# Patient Record
Sex: Female | Born: 1965
Health system: Southern US, Community
[De-identification: ages and names within clinical notes are randomized; demographics above are authoritative.]

## PROBLEM LIST (undated history)

## (undated) ENCOUNTER — Ambulatory Visit (HOSPITAL_COMMUNITY): Admission: EM | Payer: Federal, State, Local not specified - PPO

## (undated) DIAGNOSIS — D649 Anemia, unspecified: Secondary | ICD-10-CM

## (undated) DIAGNOSIS — R739 Hyperglycemia, unspecified: Secondary | ICD-10-CM

## (undated) DIAGNOSIS — F419 Anxiety disorder, unspecified: Secondary | ICD-10-CM

## (undated) DIAGNOSIS — B009 Herpesviral infection, unspecified: Secondary | ICD-10-CM

## (undated) DIAGNOSIS — G4730 Sleep apnea, unspecified: Secondary | ICD-10-CM

## (undated) HISTORY — DX: Sleep apnea, unspecified: G47.30

## (undated) HISTORY — DX: Anxiety disorder, unspecified: F41.9

## (undated) HISTORY — DX: Hyperglycemia, unspecified: R73.9

## (undated) HISTORY — PX: COLONOSCOPY: SHX174

## (undated) HISTORY — PX: WISDOM TOOTH EXTRACTION: SHX21

---

## 1998-09-20 ENCOUNTER — Emergency Department (HOSPITAL_COMMUNITY): Admission: EM | Admit: 1998-09-20 | Discharge: 1998-09-20 | Payer: Self-pay | Admitting: Emergency Medicine

## 1998-09-20 ENCOUNTER — Encounter: Payer: Self-pay | Admitting: Emergency Medicine

## 1999-09-26 ENCOUNTER — Emergency Department (HOSPITAL_COMMUNITY): Admission: EM | Admit: 1999-09-26 | Discharge: 1999-09-26 | Payer: Self-pay | Admitting: Emergency Medicine

## 1999-09-26 ENCOUNTER — Encounter: Payer: Self-pay | Admitting: Emergency Medicine

## 2000-09-04 ENCOUNTER — Other Ambulatory Visit: Admission: RE | Admit: 2000-09-04 | Discharge: 2000-09-04 | Payer: Self-pay | Admitting: Gynecology

## 2001-10-23 ENCOUNTER — Other Ambulatory Visit: Admission: RE | Admit: 2001-10-23 | Discharge: 2001-10-23 | Payer: Self-pay | Admitting: Gynecology

## 2001-10-30 ENCOUNTER — Encounter: Payer: Self-pay | Admitting: Gynecology

## 2001-10-30 ENCOUNTER — Encounter: Admission: RE | Admit: 2001-10-30 | Discharge: 2001-10-30 | Payer: Self-pay | Admitting: Gynecology

## 2002-10-31 ENCOUNTER — Other Ambulatory Visit: Admission: RE | Admit: 2002-10-31 | Discharge: 2002-10-31 | Payer: Self-pay | Admitting: Gynecology

## 2003-10-14 ENCOUNTER — Other Ambulatory Visit: Admission: RE | Admit: 2003-10-14 | Discharge: 2003-10-14 | Payer: Self-pay | Admitting: Gynecology

## 2004-05-08 ENCOUNTER — Emergency Department (HOSPITAL_COMMUNITY): Admission: EM | Admit: 2004-05-08 | Discharge: 2004-05-08 | Payer: Self-pay | Admitting: Family Medicine

## 2004-07-02 ENCOUNTER — Emergency Department (HOSPITAL_COMMUNITY): Admission: EM | Admit: 2004-07-02 | Discharge: 2004-07-02 | Payer: Self-pay | Admitting: Family Medicine

## 2004-10-05 ENCOUNTER — Ambulatory Visit (HOSPITAL_BASED_OUTPATIENT_CLINIC_OR_DEPARTMENT_OTHER): Admission: RE | Admit: 2004-10-05 | Discharge: 2004-10-05 | Payer: Self-pay | Admitting: Gynecology

## 2004-10-05 ENCOUNTER — Ambulatory Visit (HOSPITAL_COMMUNITY): Admission: RE | Admit: 2004-10-05 | Discharge: 2004-10-05 | Payer: Self-pay | Admitting: Gynecology

## 2004-10-05 ENCOUNTER — Encounter (INDEPENDENT_AMBULATORY_CARE_PROVIDER_SITE_OTHER): Payer: Self-pay | Admitting: *Deleted

## 2004-11-10 ENCOUNTER — Other Ambulatory Visit: Admission: RE | Admit: 2004-11-10 | Discharge: 2004-11-10 | Payer: Self-pay | Admitting: Gynecology

## 2005-10-24 ENCOUNTER — Other Ambulatory Visit: Admission: RE | Admit: 2005-10-24 | Discharge: 2005-10-24 | Payer: Self-pay | Admitting: Gynecology

## 2006-11-08 ENCOUNTER — Other Ambulatory Visit: Admission: RE | Admit: 2006-11-08 | Discharge: 2006-11-08 | Payer: Self-pay | Admitting: Gynecology

## 2006-11-24 ENCOUNTER — Encounter: Admission: RE | Admit: 2006-11-24 | Discharge: 2006-11-24 | Payer: Self-pay | Admitting: Gynecology

## 2007-11-22 ENCOUNTER — Encounter: Admission: RE | Admit: 2007-11-22 | Discharge: 2007-11-22 | Payer: Self-pay | Admitting: Orthopedic Surgery

## 2007-11-26 ENCOUNTER — Encounter: Admission: RE | Admit: 2007-11-26 | Discharge: 2007-11-26 | Payer: Self-pay | Admitting: Gynecology

## 2008-06-13 HISTORY — PX: ABLATION: SHX5711

## 2008-06-13 HISTORY — PX: HYSTEROSCOPY: SHX211

## 2010-06-25 ENCOUNTER — Ambulatory Visit
Admission: RE | Admit: 2010-06-25 | Discharge: 2010-06-25 | Payer: Self-pay | Source: Home / Self Care | Attending: Internal Medicine | Admitting: Internal Medicine

## 2010-06-25 DIAGNOSIS — M542 Cervicalgia: Secondary | ICD-10-CM | POA: Insufficient documentation

## 2010-06-25 DIAGNOSIS — F411 Generalized anxiety disorder: Secondary | ICD-10-CM | POA: Insufficient documentation

## 2010-06-25 DIAGNOSIS — J309 Allergic rhinitis, unspecified: Secondary | ICD-10-CM | POA: Insufficient documentation

## 2010-07-02 ENCOUNTER — Telehealth: Payer: Self-pay | Admitting: Internal Medicine

## 2010-07-04 ENCOUNTER — Encounter: Payer: Self-pay | Admitting: Gynecology

## 2010-07-15 NOTE — Assessment & Plan Note (Signed)
Summary: BRAND NEW PT/TO EST/PT REQ CPX/NO PAP/PT COMING IN FASTING/CJR   Vital Signs:  Patient profile:   45 year old female Menstrual status:  hysterectomy Height:      65.25 inches Weight:      121 pounds BMI:     20.05 Pulse rate:   70 / minute Pulse rhythm:   regular BP sitting:   98 / 60  (left arm) Cuff size:   regular  Vitals Entered By: Kyung Rudd, CMA (June 25, 2010 8:05 AM) CC: NP     Menstrual Status hysterectomy   CC:  NP.  History of Present Illness: Patient presents to clinic as a workin for evaluation of neck pain and ear discomfort. Notes recent several day h/o feeling like ears are "draining fluid" but without noted external drainage.  Also has intermittent neck pain located bilateral trapezius and posterior cervical paraspinal area.  Possibly had an episode of pain that radiated down right arm but has not recurred. Denies weakness, numbness or tingling. H/o remote fall without evaluation. H/o anxiety maintained on xanax as needed prescribed currently by her gyn who follows paps and mammograms as well. No other complaints.  Preventive Screening-Counseling & Management  Alcohol-Tobacco     Smoking Status: current  Caffeine-Diet-Exercise     Does Patient Exercise: yes      Drug Use:  yes.    Problems Prior to Update: 1)  Neck Pain  (ICD-723.1)  Current Problems (verified): 1)  Anxiety State, Unspecified  (ICD-300.00) 2)  Neck Pain  (ICD-723.1)  Current Medications (verified): 1)  Alprazolam 0.5 Mg Tabs (Alprazolam) .... 1/2 Tab At Bedtime  Allergies (verified): No Known Drug Allergies  Past History:  Past medical, surgical, family and social histories (including risk factors) reviewed, and no changes noted (except as noted below).  Past Medical History: Anxiety  Past Surgical History: Hysterectomy  Family History: Reviewed history and no changes required. Family History of Alcoholism/Addiction Family History of Anxiety Family  History of Arthritis Family History Hypertension Family History Thyroid disease  Social History: Reviewed history and no changes required. Married Current Smoker Alcohol use-yes-wine Drug use-yes-marajuana Regular exercise-yes 1 46 yr old sonSmoking Status:  current Drug Use:  yes Does Patient Exercise:  yes  Review of Systems       The patient complains of weight loss and headaches.   General:  Denies chills, fatigue, fever, and sweats. Eyes:  Denies discharge, eye irritation, and eye pain. ENT:  Complains of earache; denies decreased hearing, ear discharge, nasal congestion, and postnasal drainage. CV:  Denies chest pain or discomfort and shortness of breath with exertion. Resp:  Denies chest discomfort, cough, and shortness of breath. GI:  Denies abdominal pain, bloody stools, nausea, and vomiting. GU:  Denies dysuria, nocturia, and urinary frequency. MS:  Complains of muscle aches; denies joint pain, loss of strength, muscle weakness, stiffness, and thoracic pain. Derm:  Denies changes in color of skin, flushing, and rash. Neuro:  Denies brief paralysis, falling down, headaches, numbness, poor balance, and weakness. Psych:  Complains of anxiety; denies depression and panic attacks. Endo:  Denies excessive hunger, excessive thirst, polyuria, and weight change. Heme:  Denies abnormal bruising, enlarge lymph nodes, and fevers. Allergy:  Denies itching eyes, persistent infections, and seasonal allergies.  Physical Exam  General:  Well-developed,well-nourished,in no acute distress; alert,appropriate and cooperative throughout examination Head:  Normocephalic and atraumatic without obvious abnormalities. No apparent alopecia or balding. Eyes:  vision grossly intact, pupils equal, pupils round, pupils reactive to  light, corneas and lenses clear, and no injection.   Ears:  External ear exam shows no significant lesions or deformities.  Otoscopic examination reveals clear canals,  tympanic membranes are intact bilaterally without bulging, retraction, inflammation or discharge. Hearing is grossly normal bilaterally. Nose:  no external deformity, no external erythema, no mucosal edema, and no airflow obstruction.  +nasal clear drainage Mouth:  Oral mucosa and oropharynx without lesions or exudates.  Teeth in good repair. Neck:  No deformities, masses, or tenderness noted. Lungs:  Normal respiratory effort, chest expands symmetrically. Lungs are clear to auscultation, no crackles or wheezes. Heart:  Normal rate and regular rhythm. S1 and S2 normal without gallop, murmur, click, rub or other extra sounds. Abdomen:  Bowel sounds positive,abdomen soft and non-tender without masses, organomegaly or hernias noted. Msk:  normal ROM and no joint tenderness.   Extremities:  No clubbing, cyanosis, edema, or deformity noted with normal full range of motion of all joints.   Neurologic:  alert & oriented X3 and gait normal.   Skin:  turgor normal, color normal, and no rashes.   Cervical Nodes:  No lymphadenopathy noted Psych:  Oriented X3, normally interactive, good eye contact, not anxious appearing, and not depressed appearing.     Impression & Recommendations:  Problem # 1:  NECK PAIN (ICD-723.1) Assessment New Obtain plain radiograph of c spine. Continue topical heat as needed. Consider PT if no improvement.  Future Orders: T-Cervical Spine Comp 4 Views [id number]) ... 07/02/2010  Problem # 2:  RHINITIS (ICD-472.0) Assessment: New Suspect contribution to eustacian tube dysfunction. Begin flonase nightly. Followup if no improvement or worsening.  Problem # 3:  ANXIETY STATE, UNSPECIFIED (ICD-300.00) Assessment: Unchanged Stable and mild. States will continue to receive xanax as needed from gyn. Understands potential for addiction and/or tolerance.  Her updated medication list for this problem includes:    Alprazolam 0.5 Mg Tabs (Alprazolam) .Marland Kitchen... 1/2 tab at  bedtime  Complete Medication List: 1)  Alprazolam 0.5 Mg Tabs (Alprazolam) .... 1/2 tab at bedtime 2)  Flonase 50 Mcg/act Susp (Fluticasone propionate) .... One spray each nostril qhs  Other Orders: Tdap => 27yrs IM (96295) Admin 1st Vaccine (28413) Prescriptions: FLONASE 50 MCG/ACT SUSP (FLUTICASONE PROPIONATE) one spray each nostril qhs  #1 x 11   Entered and Authorized by:   Edwyna Perfect MD   Signed by:   Edwyna Perfect MD on 06/25/2010   Method used:   Electronically to        [facility]  [address] [phone]* (retail)       [address]       [place], [place]  [address]       Ph: [phone]       Fax: [phone]   RxID:   [id number]    Orders Added: 1)  T-Cervical Spine Comp 4 Views [72050TC] 2)  Tdap => 13yrs IM [90715] 3)  Admin 1st Vaccine [90471] 4)  New Patient Level IV [30160]   Immunizations Administered:  Tetanus Vaccine:    Vaccine Type: Tdap    Site: right deltoid    Mfr: GlaxoSmithKline    Dose: 0.5 ml    Route: IM    Given by: Kyung Rudd, CMA    Exp. Date: 04/01/2012    Lot #: [id number]   Immunizations Administered:  Tetanus Vaccine:    Vaccine Type: Tdap    Site: right deltoid    Mfr: GlaxoSmithKline  Dose: 0.5 ml    Route: IM    Given by: Kyung Rudd, CMA    Exp. Date: 04/01/2012    Lot #: [id number]

## 2010-07-15 NOTE — Progress Notes (Signed)
----   Converted from flag ---- ---- 07/02/2010 10:25 AM, Edwyna Perfect MD wrote: note says consider pt if no better. ?no better if gyn didn't get routine bloodwork then can she come in for fasting labs cbc, chem7, tsh, lipid and ua dxv70.0  ---- 07/02/2010 10:13 AM, Kyung Rudd, CMA wrote: is she suppose to have a physical therapy referral in process? She left Terri a msg about this morning. Pt also asked about some blood work that she was suppose to have done here. She said that her doctor did not do any blood work and told her that she was suppose to get it done here...she did say that she would ask the doctor about it on Monday ------------------------------  Phone Note Outgoing Call   Call placed by: Kyung Rudd, CMA,  July 02, 2010 10:28 AM Call placed to: Patient Summary of Call: pt says her neck and back are not feeling any better and would like the PT referral. will also make appt to come in for fasting labs Initial call taken by: Kyung Rudd, CMA,  July 02, 2010 10:29 AM  Follow-up for Phone Call        referral order in Follow-up by: Edwyna Perfect MD,  July 02, 2010 2:05 PM  Additional Follow-up for Phone Call Additional follow up Details #1::        pt aware Additional Follow-up by: Kyung Rudd, CMA,  July 02, 2010 2:48 PM

## 2010-07-22 ENCOUNTER — Ambulatory Visit: Payer: Self-pay | Admitting: Internal Medicine

## 2010-07-23 ENCOUNTER — Encounter: Payer: Self-pay | Admitting: Internal Medicine

## 2010-07-26 ENCOUNTER — Encounter: Payer: Self-pay | Admitting: Internal Medicine

## 2010-07-26 ENCOUNTER — Ambulatory Visit (INDEPENDENT_AMBULATORY_CARE_PROVIDER_SITE_OTHER): Payer: Managed Care, Other (non HMO) | Admitting: Internal Medicine

## 2010-07-26 VITALS — BP 100/76 | HR 78 | Temp 97.8°F | Ht 64.0 in | Wt 124.0 lb

## 2010-07-26 DIAGNOSIS — M542 Cervicalgia: Secondary | ICD-10-CM

## 2010-07-26 MED ORDER — METHYLPREDNISOLONE (PAK) 4 MG PO TABS: 4.0000 mg | ORAL_TABLET | Freq: Every day | ORAL | Status: DC

## 2010-07-26 NOTE — Assessment & Plan Note (Signed)
Schedule plain cspine xray given remote trauma. Begin medrol dosepak. Avoid nsaids while taking dosepak. Followup if no improvement or worsening.

## 2010-07-26 NOTE — Progress Notes (Signed)
  Subjective:    Patient ID: Christie [name], female    DOB: October 03, 1965, 45 y.o.   MRN: [id number]  HPI Pt presents to clinic for evaluation of neck pain. Continues to have intermittent posterior neck pain, trapezius pain now with intermittent posterior neck paresthesia radiating sometimes to the left scapula. Does also note intermittent right upper arm radiation without muscle weakness. Denies injury/trauma although does recall remote neck trauma. Has been taking otc nsaid without improvement. Yesterday took unspecified prescription nsaid with signficant improvement. Denies other alleviating or exacerbating factors.   Reviewed PMH, medications, and allergies.  Review of Systems  Musculoskeletal: Negative for myalgias, back pain, joint swelling, arthralgias and gait problem.  Skin: Negative for color change and rash.  Neurological: Positive for numbness. Negative for tremors and weakness.       Objective:   Physical Exam  Constitutional: She appears well-developed and well-nourished. No distress.  HENT:  Head: Normocephalic and atraumatic.  Eyes: Conjunctivae are normal. No scleral icterus.  Neck: Normal range of motion. Neck supple.  Musculoskeletal:       RUE FROM. RUE 5/5 distal  Neurological: She is alert.  Skin: She is not diaphoretic.          Assessment & Plan:

## 2010-07-30 ENCOUNTER — Ambulatory Visit (INDEPENDENT_AMBULATORY_CARE_PROVIDER_SITE_OTHER)
Admission: RE | Admit: 2010-07-30 | Discharge: 2010-07-30 | Disposition: A | Discharge status: Home / Self Care | Payer: Managed Care, Other (non HMO) | Source: Ambulatory Visit | Attending: Internal Medicine | Admitting: Internal Medicine

## 2010-07-30 DIAGNOSIS — M542 Cervicalgia: Secondary | ICD-10-CM

## 2010-08-05 ENCOUNTER — Telehealth: Payer: Self-pay

## 2010-08-05 ENCOUNTER — Other Ambulatory Visit: Payer: Self-pay

## 2010-08-05 DIAGNOSIS — M62838 Other muscle spasm: Secondary | ICD-10-CM

## 2010-08-05 MED ORDER — CYCLOBENZAPRINE HCL 10 MG PO TABS: 10.0000 mg | ORAL_TABLET | Freq: Three times a day (TID) | ORAL | Status: DC | PRN

## 2010-08-05 NOTE — Telephone Encounter (Signed)
Pt.notified

## 2010-08-05 NOTE — Telephone Encounter (Signed)
Message copied by Kyung Rudd on Thu Aug 05, 2010  9:54 AM ------      Message from: Letitia Libra, Maisie Fus      Created: Wed Aug 04, 2010  1:22 PM       Neck xray nl except some evidence to suggest mild muscle spasm

## 2010-10-29 NOTE — Op Note (Signed)
NAMELIESA, TSAN NO.:  [id number]   MEDICAL RECORD NO.:  [id number]          PATIENT TYPE:  AMB   LOCATION:  [facility]                         FACILITY:  [facility]   PHYSICIAN:  Gretta Cool, M.D. DATE OF BIRTH:  [birth year]   DATE OF PROCEDURE:  DATE OF DISCHARGE:                                 OPERATIVE REPORT   PREOPERATIVE DIAGNOSIS:  Abnormal uterine bleeding with multiple uterine  leiomyomata.   POSTOPERATIVE DIAGNOSIS:  Multiple small submucous fibroids with abnormal  uterine bleeding.   PROCEDURE:  Hysteroscopy, resection of multiple small submucous fibroids,  total endometrial ablation by resection, plus VaporTrode.   SURGEON:  Gretta Cool, M.D.   ANESTHESIA:  IV sedation and paracervical block.   DESCRIPTION OF PROCEDURE:  Under excellent anesthesia as above with the  patient prepped and draped in lithotomy position in [place] stirrups with a  weighted speculum placed in her vagina, the cervix was grasped with a single-  tooth tenaculum and pulled down into view.  The cervix was then  progressively dilated with a series of Pratt dilators to accommodate the 7-  mm resectoscope.  The resectoscope was then used to photograph the cavity.  There was a prominent bulge on the anterior wall of the uterus that appeared  to be a submucous fibroid.  There was no other evidence of intrusion.  Upon  resecting the anterior wall, multiple small submucous fibroids were  encountered and resected.  The entire cavity was then resected and other  small fibroids were identified during the resection.  At the end of the  procedure, the entire endometrial cavity was resected down 5 mm or more into  the myometrium.  At this point, the VaporTrode electrode was applied and the  entire cavity treated at 200 watts pure cut with VaporTrode so as to  eliminate any islands of viable endometrial tissue.  At a reduced pressure,  then the bleeding points were cauterized with  cautery through the  VaporTrode.  At this point, bleeding was minimal at 30 mm pressure.  The  procedure was then terminated without complication and the patient returned  to the recovery room in excellent condition.  Note fluid deficit was 0.      CWL/MEDQ  D:  10/05/2004  T:  10/05/2004  Job:  [id number]

## 2010-11-22 ENCOUNTER — Other Ambulatory Visit: Payer: Self-pay | Admitting: Internal Medicine

## 2011-02-01 ENCOUNTER — Other Ambulatory Visit: Payer: Self-pay | Admitting: Gynecology

## 2011-02-08 ENCOUNTER — Telehealth: Payer: Self-pay

## 2011-02-08 ENCOUNTER — Telehealth: Payer: Self-pay | Admitting: *Deleted

## 2011-02-08 NOTE — Telephone Encounter (Signed)
Pt requested a call back to discuss "things going on" in her body.  Call placed to pt. Pt c/o SOB, some chest pain, continued burning in back and numbness in toes. Pt wants to know if she should schedule a cpe with pcp. Advised pt to go to ED immediately if sxs of MI begin to occur (pt advised of sxs). Also advised pt of Dr. Ilda Foil new office location. Pt is to call office to schedule appointment with Dr. Rodena Medin ASAP.

## 2011-02-08 NOTE — Telephone Encounter (Signed)
Patient called and left voice message, requesting a return call. Message left did not state purpose of call.

## 2011-02-10 NOTE — Telephone Encounter (Signed)
Call returned to patient at [phone], no answer. A voice message was left informing patient call was returned.

## 2011-02-11 NOTE — Telephone Encounter (Signed)
No return call from patient.

## 2011-02-18 ENCOUNTER — Encounter: Payer: Self-pay | Admitting: Internal Medicine

## 2011-02-18 ENCOUNTER — Ambulatory Visit (INDEPENDENT_AMBULATORY_CARE_PROVIDER_SITE_OTHER): Payer: Managed Care, Other (non HMO) | Admitting: Internal Medicine

## 2011-02-18 VITALS — BP 118/72 | HR 91 | Temp 98.3°F | Resp 16 | Ht 64.0 in | Wt 127.0 lb

## 2011-02-18 DIAGNOSIS — R202 Paresthesia of skin: Secondary | ICD-10-CM

## 2011-02-18 DIAGNOSIS — R209 Unspecified disturbances of skin sensation: Secondary | ICD-10-CM

## 2011-02-18 DIAGNOSIS — M542 Cervicalgia: Secondary | ICD-10-CM

## 2011-02-18 DIAGNOSIS — R079 Chest pain, unspecified: Secondary | ICD-10-CM

## 2011-02-18 DIAGNOSIS — M5412 Radiculopathy, cervical region: Secondary | ICD-10-CM

## 2011-02-18 DIAGNOSIS — R0789 Other chest pain: Secondary | ICD-10-CM

## 2011-02-18 MED ORDER — PREDNISONE 10 MG PO TABS: ORAL_TABLET | ORAL | Status: AC

## 2011-02-19 DIAGNOSIS — R202 Paresthesia of skin: Secondary | ICD-10-CM | POA: Insufficient documentation

## 2011-02-19 DIAGNOSIS — R0789 Other chest pain: Secondary | ICD-10-CM | POA: Insufficient documentation

## 2011-02-19 NOTE — Assessment & Plan Note (Signed)
EKG obtained demonstrates SB with nl intervals and axis. Hx suggestive of msk etiology. ?cervical angina

## 2011-02-19 NOTE — Progress Notes (Signed)
  Subjective:    Patient ID: Christie [name], female    DOB: [birth year], 45 y.o.   MRN: [id number]  HPI Pt presents to clinic for evaluation of multiple medical problems. Notes continuing neck pain with radiation down the right arm. No paresthesia of arm or arm weakness. Previously improved with prednisone taper. cspine xray suggested straightening. Has intermittent CP described as sharp or a muscle pulling sensation and lasts only several seconds. May be positional and often occurs with coinciding neck tightness. Notes intermittent bilateral toe numbness but no radicular leg pain, leg weakness or back pain. No other complaints.  Past Medical History  Diagnosis Date  . Anxiety    Past Surgical History  Procedure Date  . Abdominal hysterectomy     reports that she has been smoking Cigarettes.  She has never used smokeless tobacco. She reports that she drinks alcohol. She reports that she uses illicit drugs (Marijuana). family history includes Alcohol abuse in an unspecified family member; Anxiety disorder in an unspecified family member; Arthritis in an unspecified family member; Hypertension in an unspecified family member; and Thyroid disease in an unspecified family member. No Known Allergies     Review of Systems see hpi     Objective:   Physical Exam  Nursing note and vitals reviewed. Constitutional: She appears well-developed and well-nourished. No distress.  HENT:  Head: Normocephalic and atraumatic.  Right Ear: External ear normal.  Left Ear: External ear normal.  Cardiovascular: Normal rate, regular rhythm and normal heart sounds.  Exam reveals no gallop and no friction rub.   No murmur heard. Pulmonary/Chest: Effort normal and breath sounds normal. No respiratory distress. She has no wheezes. She has no rales.  Musculoskeletal:       5/5 distal mcp and phalange strength of RUE.   Neurological: She is alert.  Skin: Skin is warm and dry. She is not diaphoretic.    Psychiatric: She has a normal mood and affect.          Assessment & Plan:

## 2011-02-19 NOTE — Assessment & Plan Note (Signed)
Obtain b12 level. Currently doubt relationship to cervical sx's.

## 2011-02-19 NOTE — Assessment & Plan Note (Signed)
Prednisone taper and PT. If sx's persist then discuss cervical mri

## 2011-03-02 ENCOUNTER — Telehealth: Payer: Self-pay | Admitting: Internal Medicine

## 2011-03-02 NOTE — Telephone Encounter (Signed)
Patient states that at last visit Dr. Rodena Medin told her that he was going to fill out paperwork for her to start physical therapy. Patient is calling to check on that.

## 2011-03-02 NOTE — Telephone Encounter (Signed)
i placed referral for PT 9/7. Not sure of status

## 2011-03-02 NOTE — Telephone Encounter (Signed)
Call returned to patient at [phone], she was informed PT order was in place and office would contact her with the appointment date and time. She was advised to call back if she has not heard from the office by Tuesday. Patient verbalized understanding and agrees as instructed.

## 2011-03-21 ENCOUNTER — Ambulatory Visit: Payer: Managed Care, Other (non HMO) | Attending: Internal Medicine | Admitting: Physical Therapy

## 2011-03-21 DIAGNOSIS — M5412 Radiculopathy, cervical region: Secondary | ICD-10-CM | POA: Insufficient documentation

## 2011-03-21 DIAGNOSIS — M542 Cervicalgia: Secondary | ICD-10-CM | POA: Insufficient documentation

## 2011-03-21 DIAGNOSIS — IMO0001 Reserved for inherently not codable concepts without codable children: Secondary | ICD-10-CM | POA: Insufficient documentation

## 2011-03-24 ENCOUNTER — Ambulatory Visit: Payer: Managed Care, Other (non HMO) | Admitting: Physical Therapy

## 2011-03-28 ENCOUNTER — Ambulatory Visit: Payer: Managed Care, Other (non HMO)

## 2011-03-31 ENCOUNTER — Ambulatory Visit: Payer: Managed Care, Other (non HMO) | Admitting: Physical Therapy

## 2011-04-01 ENCOUNTER — Ambulatory Visit: Payer: Managed Care, Other (non HMO) | Admitting: Internal Medicine

## 2011-04-01 DIAGNOSIS — Z0289 Encounter for other administrative examinations: Secondary | ICD-10-CM

## 2011-04-05 ENCOUNTER — Ambulatory Visit: Payer: Managed Care, Other (non HMO) | Admitting: Physical Therapy

## 2011-04-07 ENCOUNTER — Ambulatory Visit: Payer: Managed Care, Other (non HMO) | Admitting: Physical Therapy

## 2011-04-11 ENCOUNTER — Ambulatory Visit: Payer: Managed Care, Other (non HMO) | Admitting: Physical Therapy

## 2011-04-13 ENCOUNTER — Ambulatory Visit: Payer: Managed Care, Other (non HMO) | Admitting: Physical Therapy

## 2011-04-19 ENCOUNTER — Ambulatory Visit: Payer: Managed Care, Other (non HMO) | Attending: Internal Medicine | Admitting: Physical Therapy

## 2011-04-19 DIAGNOSIS — M5412 Radiculopathy, cervical region: Secondary | ICD-10-CM | POA: Insufficient documentation

## 2011-04-19 DIAGNOSIS — IMO0001 Reserved for inherently not codable concepts without codable children: Secondary | ICD-10-CM | POA: Insufficient documentation

## 2011-04-19 DIAGNOSIS — M542 Cervicalgia: Secondary | ICD-10-CM | POA: Insufficient documentation

## 2011-04-21 ENCOUNTER — Ambulatory Visit: Payer: Managed Care, Other (non HMO) | Admitting: Physical Therapy

## 2011-05-03 ENCOUNTER — Ambulatory Visit: Payer: Managed Care, Other (non HMO) | Admitting: Physical Therapy

## 2011-05-10 ENCOUNTER — Other Ambulatory Visit: Payer: Self-pay | Admitting: Gynecology

## 2011-05-10 DIAGNOSIS — Z1231 Encounter for screening mammogram for malignant neoplasm of breast: Secondary | ICD-10-CM

## 2011-05-12 ENCOUNTER — Ambulatory Visit: Payer: Managed Care, Other (non HMO) | Admitting: Physical Therapy

## 2011-05-19 ENCOUNTER — Encounter: Payer: Managed Care, Other (non HMO) | Admitting: Physical Therapy

## 2011-06-02 ENCOUNTER — Ambulatory Visit
Admission: RE | Admit: 2011-06-02 | Discharge: 2011-06-02 | Disposition: A | Discharge status: Home / Self Care | Payer: Managed Care, Other (non HMO) | Source: Ambulatory Visit | Attending: Gynecology | Admitting: Gynecology

## 2011-06-02 DIAGNOSIS — Z1231 Encounter for screening mammogram for malignant neoplasm of breast: Secondary | ICD-10-CM

## 2011-06-10 ENCOUNTER — Ambulatory Visit (INDEPENDENT_AMBULATORY_CARE_PROVIDER_SITE_OTHER): Payer: Managed Care, Other (non HMO) | Admitting: Internal Medicine

## 2011-06-10 ENCOUNTER — Encounter: Payer: Self-pay | Admitting: Internal Medicine

## 2011-06-10 VITALS — BP 102/70 | HR 65 | Temp 98.5°F | Resp 16 | Wt 127.0 lb

## 2011-06-10 DIAGNOSIS — J069 Acute upper respiratory infection, unspecified: Secondary | ICD-10-CM

## 2011-06-10 MED ORDER — FLUTICASONE PROPIONATE 50 MCG/ACT NA SUSP: 2.0000 | Freq: Every day | NASAL | Status: DC

## 2011-06-10 MED ORDER — CYCLOBENZAPRINE HCL 10 MG PO TABS: 10.0000 mg | ORAL_TABLET | Freq: Two times a day (BID) | ORAL | Status: DC | PRN

## 2011-06-10 MED ORDER — AZITHROMYCIN 250 MG PO TABS: ORAL_TABLET | ORAL | Status: AC

## 2011-06-14 DIAGNOSIS — J069 Acute upper respiratory infection, unspecified: Secondary | ICD-10-CM | POA: Insufficient documentation

## 2011-06-14 NOTE — Progress Notes (Signed)
  Subjective:    Patient ID: Christie [name], female    DOB: 1966-05-25, 46 y.o.   MRN: [id number]  HPI Pt presents to clinic for evaluation of ST. Notes two week h/o bilateral ear pain, ST and cough productive for yellow sputum. No hemoptysis, fever or chills. No alleviating or exacerbating factors. No other complaints.  Past Medical History  Diagnosis Date  . Anxiety    Past Surgical History  Procedure Date  . Abdominal hysterectomy     reports that she has been smoking Cigarettes.  She has never used smokeless tobacco. She reports that she drinks alcohol. She reports that she uses illicit drugs (Marijuana). family history includes Alcohol abuse in an unspecified family member; Anxiety disorder in an unspecified family member; Arthritis in an unspecified family member; Hypertension in an unspecified family member; and Thyroid disease in an unspecified family member. No Known Allergies\  Review of Systems see hpi     Objective:   Physical Exam  Nursing note and vitals reviewed. Constitutional: She appears well-developed and well-nourished. No distress.  HENT:  Head: Normocephalic and atraumatic.  Right Ear: Tympanic membrane, external ear and ear canal normal.  Left Ear: Tympanic membrane, external ear and ear canal normal.  Nose: Nose normal.  Mouth/Throat: Oropharynx is clear and moist. No oropharyngeal exudate.  Eyes: Conjunctivae are normal. Right eye exhibits no discharge. Left eye exhibits no discharge. No scleral icterus.  Neck: Neck supple.  Cardiovascular: Normal rate, regular rhythm and normal heart sounds.   Pulmonary/Chest: Effort normal and breath sounds normal. No respiratory distress. She has no wheezes. She has no rales.  Neurological: She is alert.  Skin: Skin is warm and dry. She is not diaphoretic.  Psychiatric: She has a normal mood and affect.          Assessment & Plan:

## 2011-06-14 NOTE — Assessment & Plan Note (Signed)
Attempt zpak. Followup if no improvement or worsening.  

## 2011-08-03 ENCOUNTER — Ambulatory Visit (INDEPENDENT_AMBULATORY_CARE_PROVIDER_SITE_OTHER): Payer: Managed Care, Other (non HMO) | Admitting: Internal Medicine

## 2011-08-03 ENCOUNTER — Encounter: Payer: Self-pay | Admitting: Internal Medicine

## 2011-08-03 VITALS — BP 122/70 | HR 64 | Temp 97.7°F | Resp 18 | Ht 64.0 in | Wt 129.0 lb

## 2011-08-03 DIAGNOSIS — J069 Acute upper respiratory infection, unspecified: Secondary | ICD-10-CM

## 2011-08-03 MED ORDER — DOXYCYCLINE HYCLATE 100 MG PO TABS: 100.0000 mg | ORAL_TABLET | Freq: Two times a day (BID) | ORAL | Status: AC

## 2011-08-07 NOTE — Assessment & Plan Note (Signed)
Given abx to hold. Begin if sx's do not improve after total duration of 8-10 days. Followup if no improvement or worsening.  

## 2011-08-07 NOTE — Progress Notes (Signed)
  Subjective:    Patient ID: Christie [name], female    DOB: [birth year], 46 y.o.   MRN: [id number]  HPI Pt presents to clinic for evaluation of cough. Notes 4 day h/o cough productive for yellow sputum without hemopytsis. Has nasal congestion and drainage without f/c or dyspnea. Taking otc medication without significant improvement. No other alleviating or exacerbating factors. No other complaints.  Past Medical History  Diagnosis Date  . Anxiety    Past Surgical History  Procedure Date  . Abdominal hysterectomy     reports that she has been smoking Cigarettes.  She has never used smokeless tobacco. She reports that she drinks alcohol. She reports that she uses illicit drugs (Marijuana). family history includes Alcohol abuse in an unspecified family member; Anxiety disorder in an unspecified family member; Arthritis in an unspecified family member; Hypertension in an unspecified family member; and Thyroid disease in an unspecified family member. No Known Allergies   Review of Systems see hpi     Objective:   Physical Exam  Constitutional: She appears well-developed and well-nourished. No distress.  HENT:  Head: Normocephalic and atraumatic.  Right Ear: External ear normal.  Left Ear: External ear normal.  Nose: Nose normal.  Mouth/Throat: Oropharynx is clear and moist. No oropharyngeal exudate.  Eyes: Conjunctivae are normal. No scleral icterus.  Neck: Neck supple.  Cardiovascular: Normal rate, regular rhythm and normal heart sounds.   Pulmonary/Chest: Effort normal and breath sounds normal. No respiratory distress. She has no wheezes. She has no rales.  Skin: Skin is warm and dry. She is not diaphoretic.  Psychiatric: She has a normal mood and affect.          Assessment & Plan:

## 2012-02-07 ENCOUNTER — Other Ambulatory Visit: Payer: Self-pay | Admitting: Gynecology

## 2012-04-03 ENCOUNTER — Ambulatory Visit (INDEPENDENT_AMBULATORY_CARE_PROVIDER_SITE_OTHER): Payer: Managed Care, Other (non HMO) | Admitting: Internal Medicine

## 2012-04-03 ENCOUNTER — Encounter: Payer: Self-pay | Admitting: Internal Medicine

## 2012-04-03 ENCOUNTER — Encounter: Payer: Self-pay | Admitting: *Deleted

## 2012-04-03 VITALS — BP 100/78 | HR 71 | Temp 98.1°F | Resp 14 | Wt 122.8 lb

## 2012-04-03 DIAGNOSIS — M549 Dorsalgia, unspecified: Secondary | ICD-10-CM | POA: Insufficient documentation

## 2012-04-03 MED ORDER — DICLOFENAC SODIUM 75 MG PO TBEC: 75.0000 mg | DELAYED_RELEASE_TABLET | Freq: Two times a day (BID) | ORAL | Status: DC | PRN

## 2012-04-03 MED ORDER — KETOROLAC TROMETHAMINE 30 MG/ML IJ SOLN
30.0000 mg | Freq: Once | INTRAMUSCULAR | Status: AC
  Administered 2012-04-03: 30 mg via INTRAMUSCULAR

## 2012-04-03 NOTE — Assessment & Plan Note (Signed)
Appears to be most consistent with musculoskeletal. Given Toradol IM injection today. Stop Motrin and begin Voltaren to be taken with food and no other anti-inflammatory. Continue Flexeril when necessary. Followup if no improvement or worsening.

## 2012-04-03 NOTE — Progress Notes (Signed)
  Subjective:    Patient ID: Christie [name], female    DOB: 1966-05-01, 46 y.o.   MRN: [id number]  HPI Pt presents to clinic for evaluation of back pain. Notes approximately 2 day history of bilateral lumbar back pain without radicular pain or leg weakness. No fall or injury however has been assisting in pulling her mother who was recently discharged from the hospital. Right side pain is worse than left. Has been attempting over-the-counter Motrin and Flexeril. Pain is worsened with position change. No other alleviating or exacerbating factors.  Past Medical History  Diagnosis Date  . Anxiety    Past Surgical History  Procedure Date  . Abdominal hysterectomy     reports that she has been smoking Cigarettes.  She has never used smokeless tobacco. She reports that she drinks alcohol. She reports that she uses illicit drugs (Marijuana). family history includes Alcohol abuse in an unspecified family member; Anxiety disorder in an unspecified family member; Arthritis in an unspecified family member; Hypertension in an unspecified family member; and Thyroid disease in an unspecified family member. No Known Allergies   Review of Systems see hpi     Objective:   Physical Exam  Nursing note and vitals reviewed. Constitutional: She appears well-developed and well-nourished. No distress.  HENT:  Head: Normocephalic and atraumatic.  Musculoskeletal:       No midline lumbar sacral tenderness or bony abnormality. Reproducible tenderness right greater than left lumbar paraspinal muscle. Bilateral lower extremity strength 5/5. Gait normal.  Neurological: She is alert.  Skin: She is not diaphoretic.  Psychiatric: She has a normal mood and affect.          Assessment & Plan:

## 2012-05-30 ENCOUNTER — Other Ambulatory Visit: Payer: Self-pay | Admitting: Internal Medicine

## 2012-05-30 NOTE — Telephone Encounter (Signed)
Rx to pharmacy/[facility] 

## 2012-06-21 ENCOUNTER — Encounter: Payer: Self-pay | Admitting: Internal Medicine

## 2012-06-21 ENCOUNTER — Ambulatory Visit (INDEPENDENT_AMBULATORY_CARE_PROVIDER_SITE_OTHER): Payer: Federal, State, Local not specified - PPO | Admitting: Internal Medicine

## 2012-06-21 VITALS — BP 100/50 | HR 105 | Temp 98.4°F | Resp 16 | Wt 121.1 lb

## 2012-06-21 DIAGNOSIS — Z23 Encounter for immunization: Secondary | ICD-10-CM

## 2012-06-21 DIAGNOSIS — J069 Acute upper respiratory infection, unspecified: Secondary | ICD-10-CM

## 2012-06-21 MED ORDER — AZITHROMYCIN 250 MG PO TABS: ORAL_TABLET | ORAL | Status: AC

## 2012-06-24 NOTE — Assessment & Plan Note (Signed)
Given printed abx to hold. Begin if sx's do not improve after total duration of 8-10 days. Followup if no improvement or worsening.

## 2012-06-24 NOTE — Progress Notes (Signed)
  Subjective:    Patient ID: Christie [name], female    DOB: 1966/04/04, 47 y.o.   MRN: [id number]  HPI Pt presents to clinic for evaluation of cough. Notes 5d h/o cough NP with nasal drainage and intermittent subjective fever. No alleviating or exacerbating factors.  Past Medical History  Diagnosis Date  . Anxiety    Past Surgical History  Procedure Date  . Abdominal hysterectomy     reports that she has been smoking Cigarettes.  She has never used smokeless tobacco. She reports that she drinks alcohol. She reports that she uses illicit drugs (Marijuana). family history includes Alcohol abuse in an unspecified family member; Anxiety disorder in an unspecified family member; Arthritis in an unspecified family member; Hypertension in an unspecified family member; and Thyroid disease in an unspecified family member. No Known Allergies   Review of Systems see hpi     Objective:   Physical Exam  Nursing note and vitals reviewed. Constitutional: She appears well-developed and well-nourished. No distress.  HENT:  Head: Normocephalic and atraumatic.  Right Ear: Tympanic membrane, external ear and ear canal normal.  Left Ear: Tympanic membrane, external ear and ear canal normal.  Nose: Nose normal.  Mouth/Throat: Oropharynx is clear and moist. No oropharyngeal exudate.  Eyes: Conjunctivae normal are normal. No scleral icterus.  Neck: Neck supple.  Pulmonary/Chest: Effort normal and breath sounds normal. No respiratory distress. She has no wheezes. She has no rales.  Lymphadenopathy:    She has no cervical adenopathy.  Neurological: She is alert.  Skin: Skin is warm and dry. She is not diaphoretic.  Psychiatric: She has a normal mood and affect.          Assessment & Plan:

## 2012-06-26 NOTE — Progress Notes (Signed)
This encounter was created in error - please disregard.

## 2012-12-03 ENCOUNTER — Other Ambulatory Visit: Payer: Self-pay | Admitting: Internal Medicine

## 2013-02-19 ENCOUNTER — Other Ambulatory Visit: Payer: Self-pay

## 2013-02-19 DIAGNOSIS — Z1231 Encounter for screening mammogram for malignant neoplasm of breast: Secondary | ICD-10-CM

## 2013-02-25 ENCOUNTER — Encounter: Payer: Self-pay | Admitting: Family

## 2013-02-25 ENCOUNTER — Ambulatory Visit (INDEPENDENT_AMBULATORY_CARE_PROVIDER_SITE_OTHER): Payer: Federal, State, Local not specified - PPO | Admitting: Family

## 2013-02-25 VITALS — BP 98/70 | HR 67 | Temp 98.1°F | Resp 18 | Wt 126.0 lb

## 2013-02-25 DIAGNOSIS — Z23 Encounter for immunization: Secondary | ICD-10-CM

## 2013-02-25 DIAGNOSIS — L659 Nonscarring hair loss, unspecified: Secondary | ICD-10-CM

## 2013-02-25 LAB — CBC WITH DIFFERENTIAL/PLATELET
Basophils Absolute: 0 10*3/uL (ref 0.0–0.1)
Basophils Relative: 0 % (ref 0–1)
Lymphocytes Relative: 30 % (ref 12–46)
MCHC: 33.8 g/dL (ref 30.0–36.0)
Monocytes Absolute: 0.5 10*3/uL (ref 0.1–1.0)
Neutro Abs: 5 10*3/uL (ref 1.7–7.7)
Platelets: 193 10*3/uL (ref 150–400)
RDW: 14.5 % (ref 11.5–15.5)
WBC: 8 10*3/uL (ref 4.0–10.5)

## 2013-02-25 NOTE — Assessment & Plan Note (Signed)
Discussed addition of biotin.  Will obtain tfts and cbc to evaluate for hypothyroidism and anemia.

## 2013-02-25 NOTE — Progress Notes (Signed)
Subjective:    Patient ID: Christie [name], female    DOB: [birth year], 47 y.o.   MRN: [id number]  HPI  Christie [name] is a 47 yr old female who presents today to discuss alopecia.  Alopecia is located on eyebrows and on the scalp.  She reports family hx of hypothyroid.  Reports that her weight fluctuates. Reports some recent weight loss which she attributes to stress as she is currently in school for massage therapy. She reports that her grandmother has thin hair. She follows with Eve Key NP and was instructed to follow up with [place] for additional blood work.    She reports thin eyebrows. She has a hair weave but notes that scalp hair is very thin.  Reports that this has been going on for "years."     Review of Systems    see HPI  Past Medical History  Diagnosis Date  . Anxiety     History   Social History  . Marital Status: Married    Spouse Name: N/A    Number of Children: N/A  . Years of Education: N/A   Occupational History  . Not on file.   Social History Main Topics  . Smoking status: Current Every Day Smoker    Types: Cigarettes  . Smokeless tobacco: Never Used     Comment: 10-15 cigarettes daily  . Alcohol Use: Yes  . Drug Use: Yes    Special: Marijuana  . Sexual Activity: Not on file   Other Topics Concern  . Not on file   Social History Narrative  . No narrative on file    Past Surgical History  Procedure Laterality Date  . Abdominal hysterectomy      Family History  Problem Relation Age of Onset  . Alcohol abuse      family hx  . Anxiety disorder      family hx  . Arthritis      family hx  . Hypertension      family hx  . Thyroid disease      family hx    No Known Allergies  Current Outpatient Prescriptions on File Prior to Visit  Medication Sig Dispense Refill  . ALPRAZolam (XANAX) 0.5 MG tablet Take 0.5 mg by mouth at bedtime as needed. 1/2 tab at bedtime       . cyclobenzaprine (FLEXERIL) 10 MG tablet Take 1 tablet (10 mg  total) by mouth 2 (two) times daily as needed for muscle spasms.  30 tablet  3  . diclofenac (VOLTAREN) 75 MG EC tablet TAKE 1 TABLET BY MOUTH TWICE A DAY AS NEEDED  30 tablet  1  . fluticasone (FLONASE) 50 MCG/ACT nasal spray USE 2 SPRAYS INTO EACH NOSTRIL DAILY.  16 g  0  . CALCIUM-VITAMIN D PO Take by mouth daily.       No current facility-administered medications on file prior to visit.    BP 98/70  Pulse 67  Temp(Src) 98.1 F (36.7 C) (Oral)  Resp 18  Wt 126 lb 0.6 oz (57.171 kg)  BMI 21.62 kg/m2  SpO2 99%    Objective:   Physical Exam  Constitutional: She is oriented to person, place, and time. She appears well-developed and well-nourished. No distress.  Cardiovascular: Normal rate and regular rhythm.   No murmur heard. Pulmonary/Chest: Effort normal and breath sounds normal. No respiratory distress. She has no wheezes. She has no rales. She exhibits no tenderness.  Neurological: She is alert and oriented to person, place,  and time.  Skin:  Eyebrows are thin.  Scalp is difficult to evaluate and she has a weave which is held by an adhesive glue.   Psychiatric: She has a normal mood and affect. Her behavior is normal. Judgment and thought content normal.          Assessment & Plan:  Pt's gyn called pt while I was in the room.  She apparently tested positive for HPV on abnormal pap and is scheduled for cryotherapy.  We discussed importance of safe sex.

## 2013-02-25 NOTE — Patient Instructions (Addendum)
Please complete lab work prior to leaving. Please schedule a fasting follow up at your convenience.

## 2013-02-26 LAB — TSH: TSH: 1.57 u[IU]/mL (ref 0.350–4.500)

## 2013-02-26 LAB — T3, FREE: T3, Free: 2.1 pg/mL — ABNORMAL LOW (ref 2.3–4.2)

## 2013-02-26 LAB — T4, FREE: Free T4: 0.98 ng/dL (ref 0.80–1.80)

## 2013-02-28 ENCOUNTER — Telehealth: Payer: Self-pay | Admitting: Family

## 2013-02-28 NOTE — Telephone Encounter (Signed)
Received medical records from [facility], NP

## 2013-03-05 ENCOUNTER — Telehealth: Payer: Self-pay | Admitting: *Deleted

## 2013-03-05 DIAGNOSIS — R7989 Other specified abnormal findings of blood chemistry: Secondary | ICD-10-CM

## 2013-03-05 NOTE — Telephone Encounter (Signed)
Message copied by Regis Bill on Tue Mar 05, 2013 11:26 AM ------      Message from: O'SULLIVAN, MELISSA      Created: Wed Feb 27, 2013  9:07 PM       Please let pt know that free t3 (thyroid test) is very mildly decreased.  I doubt this is the cause of hair loss.  I would like her to repeat tsh, free t3 free t4 in 1 month please. Blood count is normal. ------

## 2013-03-05 NOTE — Telephone Encounter (Signed)
Patient informed, understood & agreed; lab orders placed in EMR/SLS

## 2013-03-11 ENCOUNTER — Ambulatory Visit: Payer: Federal, State, Local not specified - PPO

## 2013-03-12 ENCOUNTER — Ambulatory Visit: Payer: Federal, State, Local not specified - PPO

## 2013-03-18 ENCOUNTER — Ambulatory Visit
Admission: RE | Admit: 2013-03-18 | Discharge: 2013-03-18 | Disposition: A | Discharge status: Home / Self Care | Payer: Federal, State, Local not specified - PPO | Source: Ambulatory Visit

## 2013-03-18 DIAGNOSIS — Z1231 Encounter for screening mammogram for malignant neoplasm of breast: Secondary | ICD-10-CM

## 2013-03-20 ENCOUNTER — Other Ambulatory Visit: Payer: Self-pay | Admitting: Gynecology

## 2013-03-20 DIAGNOSIS — R928 Other abnormal and inconclusive findings on diagnostic imaging of breast: Secondary | ICD-10-CM

## 2013-03-25 ENCOUNTER — Ambulatory Visit
Admission: RE | Admit: 2013-03-25 | Discharge: 2013-03-25 | Disposition: A | Discharge status: Home / Self Care | Payer: Federal, State, Local not specified - PPO | Source: Ambulatory Visit | Attending: Gynecology | Admitting: Gynecology

## 2013-03-25 DIAGNOSIS — R928 Other abnormal and inconclusive findings on diagnostic imaging of breast: Secondary | ICD-10-CM

## 2013-04-03 ENCOUNTER — Other Ambulatory Visit: Payer: Self-pay | Admitting: Family Medicine

## 2013-04-03 NOTE — Telephone Encounter (Signed)
Rx request to pharmacy/SLS  

## 2013-05-20 ENCOUNTER — Ambulatory Visit: Payer: Federal, State, Local not specified - PPO | Admitting: Family

## 2013-05-21 ENCOUNTER — Ambulatory Visit (INDEPENDENT_AMBULATORY_CARE_PROVIDER_SITE_OTHER): Payer: Federal, State, Local not specified - PPO | Admitting: Family

## 2013-05-21 ENCOUNTER — Telehealth: Payer: Self-pay | Admitting: *Deleted

## 2013-05-21 ENCOUNTER — Encounter: Payer: Self-pay | Admitting: Family

## 2013-05-21 VITALS — BP 100/70 | HR 83 | Temp 97.8°F | Resp 16 | Ht 64.0 in | Wt 121.0 lb

## 2013-05-21 DIAGNOSIS — N951 Menopausal and female climacteric states: Secondary | ICD-10-CM

## 2013-05-21 DIAGNOSIS — R946 Abnormal results of thyroid function studies: Secondary | ICD-10-CM

## 2013-05-21 DIAGNOSIS — M7700 Medial epicondylitis, unspecified elbow: Secondary | ICD-10-CM

## 2013-05-21 DIAGNOSIS — R232 Flushing: Secondary | ICD-10-CM

## 2013-05-21 LAB — T4, FREE: Free T4: 1.18 ng/dL (ref 0.80–1.80)

## 2013-05-21 LAB — T3, FREE: T3, Free: 3 pg/mL (ref 2.3–4.2)

## 2013-05-21 MED ORDER — MELOXICAM 7.5 MG PO TABS: 7.5000 mg | ORAL_TABLET | Freq: Every day | ORAL | Status: DC

## 2013-05-21 MED ORDER — GABAPENTIN 300 MG PO CAPS: 300.0000 mg | ORAL_CAPSULE | Freq: Every day | ORAL | Status: DC

## 2013-05-21 NOTE — Telephone Encounter (Signed)
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  Advised pt we do not have previous PPD on file and she states last one was several years ago. Advised her we could do skin test on Wednesday, pt will bring form and have ppd placed and return Friday for interpretation / completion of form. Nurse visit scheduled for tomorrow at 9am.

## 2013-05-21 NOTE — Assessment & Plan Note (Signed)
Trial of HS gabapentin.  

## 2013-05-21 NOTE — Assessment & Plan Note (Signed)
Bilateral. Trial of meloxicam. Plan referral to sports med if not improved.

## 2013-05-21 NOTE — Progress Notes (Signed)
Pre visit review using our clinic review tool, if applicable. No additional management support is needed unless otherwise documented below in the visit note. 

## 2013-05-21 NOTE — Patient Instructions (Signed)
Start mobic (meloxicam) once daily for next 1-2 weeks for elbow pain. Call if symptoms are not improved in 2 weeks.  Start gabapentin one tablet at bedtime for hot flashes. Follow up in 3 months.

## 2013-05-21 NOTE — Progress Notes (Signed)
Subjective:    Patient ID: Christie [name], female    DOB: 1965-12-11, 47 y.o.   MRN: [id number]  HPI  Christie [name] is a 47 yr old female who presents today to discuss two concerns:  1) Hot flashes- Hot flashes are severe at night. She is on minivelle patch. She follows with Christie Sow NP since her surgery.   She takes provera every 2 months.   She has discussed this with GYN. They do not want her dose of estrogen to be increased.    2) Elbow pain- bilateral ache at night.  Has aching medially.    Review of Systems See HPI  Past Medical History  Diagnosis Date  . Anxiety     History   Social History  . Marital Status: Married    Spouse Name: N/A    Number of Children: N/A  . Years of Education: N/A   Occupational History  . Not on file.   Social History Main Topics  . Smoking status: Current Every Day Smoker    Types: Cigarettes  . Smokeless tobacco: Never Used     Comment: 10-15 cigarettes daily  . Alcohol Use: Yes  . Drug Use: Yes    Special: Marijuana  . Sexual Activity: Not on file   Other Topics Concern  . Not on file   Social History Narrative  . No narrative on file    Past Surgical History  Procedure Laterality Date  . Abdominal hysterectomy      Family History  Problem Relation Age of Onset  . Alcohol abuse      family hx  . Anxiety disorder      family hx  . Arthritis      family hx  . Hypertension      family hx  . Thyroid disease      family hx    No Known Allergies  Current Outpatient Prescriptions on File Prior to Visit  Medication Sig Dispense Refill  . ALPRAZolam (XANAX) 0.5 MG tablet Take 0.5 mg by mouth at bedtime as needed. 1/2 tab at bedtime       . CALCIUM-VITAMIN D PO Take by mouth daily.      . cyclobenzaprine (FLEXERIL) 10 MG tablet Take 1 tablet (10 mg total) by mouth 2 (two) times daily as needed for muscle spasms.  30 tablet  3  . fluticasone (FLONASE) 50 MCG/ACT nasal spray USE 2 SPRAYS INTO EACH NOSTRIL  DAILY.  16 g  1  . MINIVELLE 0.05 MG/24HR patch Place 1 patch onto the skin 2 (two) times a week.       No current facility-administered medications on file prior to visit.    BP 100/70  Pulse 83  Temp(Src) 97.8 F (36.6 C) (Oral)  Resp 16  Ht 5\' 4"  (1.626 m)  Wt 121 lb (54.885 kg)  BMI 20.76 kg/m2  SpO2 99%       Objective:   Physical Exam  Constitutional: She is oriented to person, place, and time. She appears well-developed and well-nourished. No distress.  HENT:  Head: Normocephalic and atraumatic.  Cardiovascular: Normal rate and regular rhythm.   No murmur heard. Pulmonary/Chest: Effort normal and breath sounds normal. No respiratory distress. She has no wheezes. She has no rales. She exhibits no tenderness.  Musculoskeletal: She exhibits no edema.  Bilateral elbows without swelling. No pain to palpation  Lymphadenopathy:    She has no cervical adenopathy.  Neurological: She is alert and oriented to  person, place, and time.  Psychiatric: She has a normal mood and affect. Her behavior is normal. Judgment and thought content normal.          Assessment & Plan:

## 2013-05-22 ENCOUNTER — Encounter: Payer: Self-pay | Admitting: Family

## 2013-05-22 ENCOUNTER — Ambulatory Visit (INDEPENDENT_AMBULATORY_CARE_PROVIDER_SITE_OTHER): Payer: Federal, State, Local not specified - PPO | Admitting: Family

## 2013-05-22 DIAGNOSIS — Z111 Encounter for screening for respiratory tuberculosis: Secondary | ICD-10-CM

## 2013-05-24 LAB — TB SKIN TEST: TB Skin Test: NEGATIVE

## 2013-07-05 ENCOUNTER — Ambulatory Visit (INDEPENDENT_AMBULATORY_CARE_PROVIDER_SITE_OTHER): Payer: Federal, State, Local not specified - PPO | Admitting: Family

## 2013-07-05 ENCOUNTER — Other Ambulatory Visit: Payer: Self-pay | Admitting: *Deleted

## 2013-07-05 ENCOUNTER — Encounter: Payer: Self-pay | Admitting: Family

## 2013-07-05 VITALS — BP 102/70 | HR 73 | Temp 97.7°F | Ht 64.0 in | Wt 123.8 lb

## 2013-07-05 DIAGNOSIS — N898 Other specified noninflammatory disorders of vagina: Secondary | ICD-10-CM

## 2013-07-05 MED ORDER — FLUCONAZOLE 150 MG PO TABS: ORAL_TABLET | ORAL | Status: DC

## 2013-07-05 NOTE — Patient Instructions (Signed)
Vaginitis Vaginitis is an inflammation of the vagina. It is most often caused by a change in the normal balance of the bacteria and yeast that live in the vagina. This change in balance causes an overgrowth of certain bacteria or yeast, which causes the inflammation. There are different types of vaginitis, but the most common types are:  Bacterial vaginosis.  Yeast infection (candidiasis).  Trichomoniasis vaginitis. This is a sexually transmitted infection (STI).  Viral vaginitis.  Atropic vaginitis.  Allergic vaginitis. CAUSES  The cause depends on the type of vaginitis. Vaginitis can be caused by:  Bacteria (bacterial vaginosis).  Yeast (yeast infection).  A parasite (trichomoniasis vaginitis)  A virus (viral vaginitis).  Low hormone levels (atrophic vaginitis). Low hormone levels can occur during pregnancy, breastfeeding, or after menopause.  Irritants, such as bubble baths, scented tampons, and feminine sprays (allergic vaginitis). Other factors can change the normal balance of the yeast and bacteria that live in the vagina. These include:  Antibiotic medicines.  Poor hygiene.  Diaphragms, vaginal sponges, spermicides, birth control pills, and intrauterine devices (IUD).  Sexual intercourse.  Infection.  Uncontrolled diabetes.  A weakened immune system. SYMPTOMS  Symptoms can vary depending on the cause of the vaginitis. Common symptoms include:  Abnormal vaginal discharge.  The discharge is white, gray, or yellow with bacterial vaginosis.  The discharge is thick, white, and cheesy with a yeast infection.  The discharge is frothy and yellow or greenish with trichomoniasis.  A bad vaginal odor.  The odor is fishy with bacterial vaginosis.  Vaginal itching, pain, or swelling.  Painful intercourse.  Pain or burning when urinating. Sometimes, there are no symptoms. TREATMENT  Treatment will vary depending on the type of infection.   Bacterial  vaginosis and trichomoniasis are often treated with antibiotic creams or pills.  Yeast infections are often treated with antifungal medicines, such as vaginal creams or suppositories.  Viral vaginitis has no cure, but symptoms can be treated with medicines that relieve discomfort. Your sexual partner should be treated as well.  Atrophic vaginitis may be treated with an estrogen cream, pill, suppository, or vaginal ring. If vaginal dryness occurs, lubricants and moisturizing creams may help. You may be told to avoid scented soaps, sprays, or douches.  Allergic vaginitis treatment involves quitting the use of the product that is causing the problem. Vaginal creams can be used to treat the symptoms. HOME CARE INSTRUCTIONS   Take all medicines as directed by your caregiver.  Keep your genital area clean and dry. Avoid soap and only rinse the area with water.  Avoid douching. It can remove the healthy bacteria in the vagina.  Do not use tampons or have sexual intercourse until your vaginitis has been treated. Use sanitary pads while you have vaginitis.  Wipe from front to back. This avoids the spread of bacteria from the rectum to the vagina.  Let air reach your genital area.  Wear cotton underwear to decrease moisture buildup.  Avoid wearing underwear while you sleep until your vaginitis is gone.  Avoid tight pants and underwear or nylons without a cotton panel.  Take off wet clothing (especially bathing suits) as soon as possible.  Use mild, non-scented products. Avoid using irritants, such as:  Scented feminine sprays.  Fabric softeners.  Scented detergents.  Scented tampons.  Scented soaps or bubble baths.  Practice safe sex and use condoms. Condoms may prevent the spread of trichomoniasis and viral vaginitis. SEEK MEDICAL CARE IF:   You have abdominal pain.  You   have a fever or persistent symptoms for more than 2 3 days.  You have a fever and your symptoms suddenly  get worse. Document Released: 03/27/2007 Document Revised: 02/22/2012 Document Reviewed: 11/10/2011 [employer] Patient Information 2014 [employer].  

## 2013-07-05 NOTE — Progress Notes (Signed)
Subjective:    Patient ID: Christie [name], female    DOB: 01-20-1966, 48 y.o.   MRN: [id number]  HPI  [name] is a 48 yr old female who presents today with chief complaint of vaginal discharge.  Vaginal discharge has been present x 1 month.  Reports discharge which is white and "chalky."  She is sexually active, does not use condoms. Same partner x 7 years. Denies associated fever.  Denies dysuria.     Review of Systems See HPI  Past Medical History  Diagnosis Date  . Anxiety     History   Social History  . Marital Status: Married    Spouse Name: N/A    Number of Children: N/A  . Years of Education: N/A   Occupational History  . Not on file.   Social History Main Topics  . Smoking status: Current Every Day Smoker    Types: Cigarettes  . Smokeless tobacco: Never Used     Comment: 10-15 cigarettes daily  . Alcohol Use: Yes  . Drug Use: Yes    Special: Marijuana  . Sexual Activity: Not on file   Other Topics Concern  . Not on file   Social History Narrative  . No narrative on file    Past Surgical History  Procedure Laterality Date  . Abdominal hysterectomy      Family History  Problem Relation Age of Onset  . Alcohol abuse      family hx  . Anxiety disorder      family hx  . Arthritis      family hx  . Hypertension      family hx  . Thyroid disease      family hx    No Known Allergies  Current Outpatient Prescriptions on File Prior to Visit  Medication Sig Dispense Refill  . ALPRAZolam (XANAX) 0.5 MG tablet Take 0.5 mg by mouth at bedtime as needed. 1/2 tab at bedtime       . CALCIUM-VITAMIN D PO Take by mouth daily.      . cyclobenzaprine (FLEXERIL) 10 MG tablet Take 1 tablet (10 mg total) by mouth 2 (two) times daily as needed for muscle spasms.  30 tablet  3  . fluticasone (FLONASE) 50 MCG/ACT nasal spray USE 2 SPRAYS INTO EACH NOSTRIL DAILY.  16 g  1  . gabapentin (NEURONTIN) 300 MG capsule Take 1 capsule (300 mg total) by  mouth at bedtime.  30 capsule  3  . meloxicam (MOBIC) 7.5 MG tablet Take 1 tablet (7.5 mg total) by mouth daily.  14 tablet  0  . MINIVELLE 0.05 MG/24HR patch Place 1 patch onto the skin 2 (two) times a week.      . progesterone (PROMETRIUM) 200 MG capsule Take 1 capsule by mouth x 12 days a month.       No current facility-administered medications on file prior to visit.    BP 102/70  Pulse 73  Temp(Src) 97.7 F (36.5 C) (Oral)  Ht 5\' 4"  (1.626 m)  Wt 123 lb 12 oz (56.133 kg)  BMI 21.23 kg/m2  SpO2 99%       Objective:   Physical Exam  Constitutional: She is oriented to person, place, and time. She appears well-developed and well-nourished. No distress.  Genitourinary:  + white thick vaginal discharge is noted.    Neurological: She is alert and oriented to person, place, and time.          Assessment & Plan:

## 2013-07-05 NOTE — Progress Notes (Signed)
Pre-visit discussion using our clinic review tool. No additional management support is needed unless otherwise documented below in the visit note.  

## 2013-07-05 NOTE — Assessment & Plan Note (Signed)
Wet prep performed. Rx sent empirically for diflucan.

## 2013-07-08 ENCOUNTER — Telehealth: Payer: Self-pay | Admitting: Family

## 2013-07-08 LAB — WET PREP BY MOLECULAR PROBE
Candida species: NEGATIVE
Gardnerella vaginalis: POSITIVE — AB
Trichomonas vaginosis: NEGATIVE

## 2013-07-08 MED ORDER — METRONIDAZOLE 0.75 % VA GEL: 1.0000 | Freq: Every day | VAGINAL | Status: DC

## 2013-07-08 NOTE — Telephone Encounter (Signed)
Relevant patient education assigned to patient using Emmi. ° °

## 2013-07-08 NOTE — Telephone Encounter (Signed)
Rx sent for metrogel for bacterial vaginosis noted on wet prep.

## 2013-07-09 MED ORDER — METRONIDAZOLE 0.75 % VA GEL: 1.0000 | Freq: Every day | VAGINAL | Status: DC

## 2013-07-09 NOTE — Telephone Encounter (Signed)
eRx transmission to pharmacy failed, re-sent rx. Left message for pt to return my call.

## 2013-07-10 ENCOUNTER — Ambulatory Visit: Payer: Federal, State, Local not specified - PPO | Admitting: Family

## 2013-07-10 NOTE — Telephone Encounter (Signed)
Notified pt. 

## 2013-08-14 ENCOUNTER — Telehealth: Payer: Self-pay | Admitting: *Deleted

## 2013-08-14 NOTE — Telephone Encounter (Signed)
Received call from pt stating she was receiving alprazolam from her gyn but they told her that they would prefer her PCP manage this going forward.  Pt is requesting a refill.  Please advise.

## 2013-08-15 NOTE — Telephone Encounter (Signed)
I am willing to take this over, but will need to see her for OV to discuss anxiety and have her sign a controlled substance contract first please.

## 2013-08-16 NOTE — Telephone Encounter (Signed)
Left message for pt to return my call.

## 2013-08-19 NOTE — Telephone Encounter (Signed)
Left message on voicemail to return my call and let us know if we can leave her a detailed message if we get her voicemail again.

## 2013-08-20 NOTE — Telephone Encounter (Signed)
Notified pt and she voices understanding. Scheduled follow up for 08/26/13 at 5:30pm.

## 2013-08-26 ENCOUNTER — Encounter: Payer: Self-pay | Admitting: Family

## 2013-08-26 ENCOUNTER — Ambulatory Visit (INDEPENDENT_AMBULATORY_CARE_PROVIDER_SITE_OTHER): Payer: Federal, State, Local not specified - PPO | Admitting: Family

## 2013-08-26 VITALS — BP 110/66 | HR 55 | Temp 98.3°F | Resp 16 | Ht 64.0 in | Wt 118.0 lb

## 2013-08-26 DIAGNOSIS — F411 Generalized anxiety disorder: Secondary | ICD-10-CM

## 2013-08-26 MED ORDER — ALPRAZOLAM 0.5 MG PO TABS: 0.5000 mg | ORAL_TABLET | Freq: Two times a day (BID) | ORAL | Status: DC | PRN

## 2013-08-26 NOTE — Progress Notes (Signed)
Subjective:    Patient ID: Christie [name], female    DOB: [birth year], 48 y.o.   MRN: [id number]  HPI  [name] is a 48 yr old female who presents today for follow up of anxiety. She is currently maintained on xanax. She is requesting that we take over the management on her xanax which has been prescribed by her GYN.  She is working on her masters.    She reports weird dreams on the hormone patch.  Feels jittery when she gets home at night.  She reports that she uses xanax at the end of the day. "to help me calm down."    On occasion she will take a 1/2 tablet during the day.    Review of Systems See HPI  Past Medical History  Diagnosis Date  . Anxiety     History   Social History  . Marital Status: Married    Spouse Name: N/A    Number of Children: N/A  . Years of Education: N/A   Occupational History  . Not on file.   Social History Main Topics  . Smoking status: Current Every Day Smoker    Types: Cigarettes  . Smokeless tobacco: Never Used     Comment: 10-15 cigarettes daily  . Alcohol Use: Yes  . Drug Use: Yes    Special: Marijuana  . Sexual Activity: Not on file   Other Topics Concern  . Not on file   Social History Narrative  . No narrative on file    Past Surgical History  Procedure Laterality Date  . Abdominal hysterectomy      Family History  Problem Relation Age of Onset  . Alcohol abuse      family hx  . Anxiety disorder      family hx  . Arthritis      family hx  . Hypertension      family hx  . Thyroid disease      family hx    No Known Allergies  Current Outpatient Prescriptions on File Prior to Visit  Medication Sig Dispense Refill  . CALCIUM-VITAMIN D PO Take by mouth daily.      . cyclobenzaprine (FLEXERIL) 10 MG tablet Take 1 tablet (10 mg total) by mouth 2 (two) times daily as needed for muscle spasms.  30 tablet  3  . fluticasone (FLONASE) 50 MCG/ACT nasal spray USE 2 SPRAYS INTO EACH NOSTRIL DAILY.  16 g  1    . gabapentin (NEURONTIN) 300 MG capsule Take 1 capsule (300 mg total) by mouth at bedtime.  30 capsule  3  . meloxicam (MOBIC) 7.5 MG tablet Take 1 tablet (7.5 mg total) by mouth daily.  14 tablet  0  . MINIVELLE 0.05 MG/24HR patch Place 1 patch onto the skin 2 (two) times a week.      . progesterone (PROMETRIUM) 200 MG capsule Take 1 capsule by mouth x 12 days a month.       No current facility-administered medications on file prior to visit.    BP 110/66  Pulse 55  Temp(Src) 98.3 F (36.8 C) (Oral)  Resp 16  Ht 5\' 4"  (1.626 m)  Wt 118 lb 0.6 oz (53.543 kg)  BMI 20.25 kg/m2  SpO2 99%       Objective:   Physical Exam  Constitutional: She is oriented to person, place, and time. She appears well-developed and well-nourished. No distress.  Neurological: She is alert and oriented to person, place, and time.  Psychiatric: She has a normal mood and affect. Her behavior is normal. Judgment and thought content normal.          Assessment & Plan:

## 2013-08-26 NOTE — Patient Instructions (Signed)
Please follow up in 6 months, sooner if problems/concerns.  

## 2013-08-26 NOTE — Progress Notes (Signed)
Pre visit review using our clinic review tool, if applicable. No additional management support is needed unless otherwise documented below in the visit note. 

## 2013-09-02 NOTE — Assessment & Plan Note (Signed)
Stable with prn xanax. Refill provided today for xanax.  A controlled substance contract was signed.

## 2013-09-17 ENCOUNTER — Other Ambulatory Visit: Payer: Self-pay | Admitting: Family

## 2013-10-14 ENCOUNTER — Other Ambulatory Visit: Payer: Self-pay | Admitting: *Deleted

## 2013-10-14 NOTE — Telephone Encounter (Signed)
See directions as below.

## 2013-10-14 NOTE — Telephone Encounter (Signed)
Pt left message requesting refill of alprazolam.  Last rx given 08/26/13, #30. Please verify directions as it currently states 1 twice a day, 1/2 at bedtime.  Also requests meloxicam. Last rx 05/2013, #14.

## 2013-10-15 MED ORDER — ALPRAZOLAM 0.5 MG PO TABS: ORAL_TABLET | ORAL | Status: DC

## 2013-10-15 MED ORDER — MELOXICAM 7.5 MG PO TABS: 7.5000 mg | ORAL_TABLET | Freq: Every day | ORAL | Status: DC

## 2013-10-15 NOTE — Telephone Encounter (Signed)
Rxs called to pharmacy voicemail. Notified pt.

## 2013-10-24 ENCOUNTER — Other Ambulatory Visit: Payer: Self-pay | Admitting: Family

## 2013-11-25 ENCOUNTER — Other Ambulatory Visit: Payer: Self-pay | Admitting: Family

## 2013-11-25 NOTE — Telephone Encounter (Signed)
Last OV 08/26/13.  Last RX dated 10/25/13 # 14 with no rf.  Please advise.

## 2013-12-23 ENCOUNTER — Other Ambulatory Visit: Payer: Self-pay | Admitting: Family

## 2013-12-23 NOTE — Telephone Encounter (Signed)
[facility] to send 30 tabs with zero refills.  

## 2013-12-24 NOTE — Telephone Encounter (Signed)
Rx called to pharmacy voicemail. 

## 2014-02-05 ENCOUNTER — Other Ambulatory Visit: Payer: Self-pay | Admitting: Family

## 2014-02-06 NOTE — Telephone Encounter (Signed)
Rx sent to pharmacy. LDM 

## 2014-02-13 ENCOUNTER — Other Ambulatory Visit: Payer: Self-pay | Admitting: Family

## 2014-02-14 NOTE — Telephone Encounter (Signed)
Alprazolam Rx faxed to pharmacy. 

## 2014-02-14 NOTE — Telephone Encounter (Signed)
Alprazolam Rx printed. Please advise re: mobic.  Medication name:  Name from pharmacy:  ALPRAZolam (XANAX) 0.5 MG tablet  ALPRAZOLAM 0.5 MG TABLET    Sig: TAKE 1 TABLET BY MOUTH TWICE A DAY AS NEEDED    Dispense: 30 tablet Refills: 0 Start: 02/13/2014  Class: Normal    Notes to pharmacy: Not to exceed 5 additional fills before 06/22/2014.    Requested on: 12/24/2013    Originally ordered on: 07/23/2010 Last refill: 12/24/2013 Order History and Details           Medication name:  Name from pharmacy:  meloxicam (MOBIC) 7.5 MG tablet  MELOXICAM 7.5 MG TABLET    Sig: TAKE 1 TABLET BY MOUTH EVERY DAY    Dispense: 14 tablet Refills: 0 Start: 02/13/2014  Class: Normal    Requested on: 10/15/2013    Originally ordered on: 05/21/2013 Last refill: 10/15/2013 Order History and Details

## 2014-03-03 ENCOUNTER — Telehealth: Payer: Self-pay | Admitting: Family

## 2014-03-03 ENCOUNTER — Ambulatory Visit: Payer: Federal, State, Local not specified - PPO | Admitting: Family

## 2014-03-03 NOTE — Telephone Encounter (Signed)
NOS appt today 03/03/14

## 2014-03-04 NOTE — Telephone Encounter (Signed)
Christie [name] [phone] [name])  [name] called to say she came to our office yesterday and we were closed and she could not get thru on the phone. I explained to her that we were now on the second floor in [address]. She had not check her mail or emails and she did not see any of the signs that told her we were on the second floor. When she called she said it kept putting her on hold, so she hung up

## 2014-03-04 NOTE — Telephone Encounter (Signed)
Please do not charge no show.

## 2014-03-10 ENCOUNTER — Ambulatory Visit: Payer: Federal, State, Local not specified - PPO | Admitting: Family

## 2014-03-13 ENCOUNTER — Other Ambulatory Visit: Payer: Self-pay | Admitting: Family

## 2014-03-14 NOTE — Telephone Encounter (Signed)
Rx faxed to pharmacy  

## 2014-03-14 NOTE — Telephone Encounter (Signed)
Last rx printed 02/14/14.  Rx printed and forwarded to Provider for signature. Pt has f/u on 03/17/14.

## 2014-03-17 ENCOUNTER — Ambulatory Visit (INDEPENDENT_AMBULATORY_CARE_PROVIDER_SITE_OTHER): Payer: Federal, State, Local not specified - PPO | Admitting: Family

## 2014-03-17 ENCOUNTER — Encounter: Payer: Self-pay | Admitting: Family

## 2014-03-17 VITALS — BP 123/77 | HR 54 | Temp 98.2°F | Resp 16 | Ht 64.0 in | Wt 120.6 lb

## 2014-03-17 DIAGNOSIS — Z23 Encounter for immunization: Secondary | ICD-10-CM

## 2014-03-17 DIAGNOSIS — M5489 Other dorsalgia: Secondary | ICD-10-CM

## 2014-03-17 DIAGNOSIS — F411 Generalized anxiety disorder: Secondary | ICD-10-CM

## 2014-03-17 DIAGNOSIS — R232 Flushing: Secondary | ICD-10-CM

## 2014-03-17 DIAGNOSIS — N951 Menopausal and female climacteric states: Secondary | ICD-10-CM

## 2014-03-17 MED ORDER — ALPRAZOLAM 0.5 MG PO TABS
ORAL_TABLET | ORAL | Status: DC
Start: 2014-03-17 — End: 2014-05-30

## 2014-03-17 NOTE — Progress Notes (Signed)
Subjective:    Patient ID: Christie [name], female    DOB: 1966/03/02, 48 y.o.   MRN: [id number]  HPI  Christie [name] is a 48 yr old female who presents today for follow up.  Anxiety- reports that she uses xanax 1/2 tab at bedtime. She reports that she finished her masters degree and feels like her overall stress level has improved.    Uses flexeril rarely after dancing for back pain.   Hot flashes- notes gabapentin helps a lot. Her GYN is treating her with minivelle and progesterone.    Review of Systems See HPI  Past Medical History  Diagnosis Date  . Anxiety     History   Social History  . Marital Status: Married    Spouse Name: N/A    Number of Children: N/A  . Years of Education: N/A   Occupational History  . Not on file.   Social History Main Topics  . Smoking status: Current Every Day Smoker    Types: Cigarettes  . Smokeless tobacco: Never Used     Comment: 10-15 cigarettes daily  . Alcohol Use: Yes  . Drug Use: Yes    Special: Marijuana  . Sexual Activity: Not on file   Other Topics Concern  . Not on file   Social History Narrative  . No narrative on file    Past Surgical History  Procedure Laterality Date  . Abdominal hysterectomy      Family History  Problem Relation Age of Onset  . Alcohol abuse      family hx  . Anxiety disorder      family hx  . Arthritis      family hx  . Hypertension      family hx  . Thyroid disease      family hx    No Known Allergies  Current Outpatient Prescriptions on File Prior to Visit  Medication Sig Dispense Refill  . ALPRAZolam (XANAX) 0.5 MG tablet TAKE 1 TABLET TWICE A DAY AS NEEDED  30 tablet  0  . CALCIUM-VITAMIN D PO Take by mouth daily.      . cyclobenzaprine (FLEXERIL) 10 MG tablet Take 1 tablet (10 mg total) by mouth 2 (two) times daily as needed for muscle spasms.  30 tablet  3  . fluticasone (FLONASE) 50 MCG/ACT nasal spray USE 2 SPRAYS INTO EACH NOSTRIL DAILY.  16 g  1  .  gabapentin (NEURONTIN) 300 MG capsule TAKE 1 CAPSULE (300 MG TOTAL) BY MOUTH AT BEDTIME.  30 capsule  1  . meloxicam (MOBIC) 7.5 MG tablet TAKE 1 TABLET BY MOUTH EVERY DAY  14 tablet  0  . MINIVELLE 0.05 MG/24HR patch Place 1 patch onto the skin 2 (two) times a week.      . progesterone (PROMETRIUM) 200 MG capsule Take 1 capsule by mouth x 12 days a month.       No current facility-administered medications on file prior to visit.    BP 123/77  Pulse 54  Temp(Src) 98.2 F (36.8 C) (Oral)  Resp 16  Ht 5\' 4"  (1.626 m)  Wt 120 lb 9.6 oz (54.704 kg)  BMI 20.69 kg/m2  SpO2 100%       Objective:   Physical Exam  Constitutional: She is oriented to person, place, and time. She appears well-developed and well-nourished. No distress.  Cardiovascular: Normal rate and regular rhythm.   No murmur heard. Pulmonary/Chest: Effort normal and breath sounds normal. No respiratory distress. She has  no wheezes. She has no rales. She exhibits no tenderness.  Musculoskeletal: She exhibits no edema.  Neurological: She is alert and oriented to person, place, and time.  Psychiatric: She has a normal mood and affect. Her behavior is normal. Judgment and thought content normal.          Assessment & Plan:

## 2014-03-17 NOTE — Progress Notes (Signed)
Pre visit review using our clinic review tool, if applicable. No additional management support is needed unless otherwise documented below in the visit note. 

## 2014-03-17 NOTE — Patient Instructions (Addendum)
Please complete lab work prior to leaving. Schedule a fasting complete schedule.

## 2014-03-20 NOTE — Assessment & Plan Note (Signed)
Stable with use of HS xanax.  Monitor.

## 2014-03-20 NOTE — Assessment & Plan Note (Signed)
Stable with gabapentin, minivelle/progesterone.  HRT per GYN.

## 2014-03-20 NOTE — Assessment & Plan Note (Signed)
OK to continue sparing use of flexeril.

## 2014-03-24 ENCOUNTER — Encounter: Payer: Self-pay | Admitting: Family

## 2014-04-08 ENCOUNTER — Other Ambulatory Visit: Payer: Self-pay | Admitting: Family

## 2014-04-09 ENCOUNTER — Other Ambulatory Visit: Payer: Self-pay | Admitting: Family

## 2014-04-21 ENCOUNTER — Encounter: Payer: Federal, State, Local not specified - PPO | Admitting: Family

## 2014-05-12 ENCOUNTER — Encounter: Payer: Federal, State, Local not specified - PPO | Admitting: Family

## 2014-05-26 ENCOUNTER — Encounter: Payer: Self-pay | Admitting: Family

## 2014-05-26 ENCOUNTER — Ambulatory Visit (INDEPENDENT_AMBULATORY_CARE_PROVIDER_SITE_OTHER): Payer: Federal, State, Local not specified - PPO | Admitting: Family

## 2014-05-26 VITALS — BP 110/78 | HR 62 | Temp 98.6°F | Resp 16 | Ht 64.0 in | Wt 114.0 lb

## 2014-05-26 DIAGNOSIS — Z0000 Encounter for general adult medical examination without abnormal findings: Secondary | ICD-10-CM

## 2014-05-26 MED ORDER — VARENICLINE TARTRATE 0.5 MG X 11 & 1 MG X 42 PO MISC: ORAL | Status: DC

## 2014-05-26 NOTE — Progress Notes (Signed)
Subjective:    Patient ID: Christie [name], female    DOB: 1966-04-20, 48 y.o.   MRN: [id number]  HPI  Patient presents today for complete physical.  Immunizations:up to date Diet: not eating regularly.  Exercise:dances 3 times a week Colonoscopy: due at 49 Pap Smear: reports that she had pap smear which was abnormal- had cervical conization Mammogram: was performed with her GYN this year and reports that this was normal. Dental- not up to date- due will do this year [facility]- reports that this is up to date.  Tobacco abuse- smokes <15 cig a day.  Intolerant to the patch due to itching.  Wt Readings from Last 3 Encounters:  05/26/14 114 lb (51.71 kg)  03/17/14 120 lb 9.6 oz (54.704 kg)  08/26/13 118 lb 0.6 oz (53.543 kg)   Breakfast-yogurt/veggie stick , crackers mid morning.  Lunch- take out, large (japanese, steaks) Eats 9:30PM- [employer]/bur[employer] sometimes her husband cooks.    Review of Systems  Constitutional: Positive for unexpected weight change.  HENT: Negative for hearing loss and rhinorrhea.   Eyes: Negative for visual disturbance.  Respiratory: Negative for shortness of breath.   Cardiovascular: Negative for leg swelling.   Past Medical History  Diagnosis Date  . Anxiety     History   Social History  . Marital Status: Married    Spouse Name: N/A    Number of Children: N/A  . Years of Education: N/A   Occupational History  . Not on file.   Social History Main Topics  . Smoking status: Current Every Day Smoker    Types: Cigarettes  . Smokeless tobacco: Never Used     Comment: 10 cigarettes daily  . Alcohol Use: 1.2 - 1.8 oz/week    2-3 Glasses of wine per week  . Drug Use: Yes    Special: Marijuana  . Sexual Activity: Not on file   Other Topics Concern  . Not on file   Social History Narrative    Past Surgical History  Procedure Laterality Date  . Hysteroscopy  2010     per patient  . Ablation  2010    uterine    Family  History  Problem Relation Age of Onset  . Alcohol abuse      family hx  . Anxiety disorder      family hx  . Arthritis      family hx  . Hypertension      family hx  . Thyroid disease      family hx  . Hypertension Mother   . Diabetes Maternal Grandmother     type II    No Known Allergies  Current Outpatient Prescriptions on File Prior to Visit  Medication Sig Dispense Refill  . CALCIUM-VITAMIN D PO Take by mouth daily.    . cyclobenzaprine (FLEXERIL) 10 MG tablet Take 1 tablet (10 mg total) by mouth 2 (two) times daily as needed for muscle spasms. 30 tablet 3  . fluticasone (FLONASE) 50 MCG/ACT nasal spray USE 2 SPRAYS INTO EACH NOSTRIL DAILY. 16 g 1  . gabapentin (NEURONTIN) 300 MG capsule TAKE 1 CAPSULE BY MOUTH AT BEDTIME 30 capsule 2  . meloxicam (MOBIC) 7.5 MG tablet TAKE 1 TABLET BY MOUTH EVERY DAY 14 tablet 1  . MINIVELLE 0.05 MG/24HR patch Place 1 patch onto the skin 2 (two) times a week.    . progesterone (PROMETRIUM) 200 MG capsule Take 1 capsule by mouth x 12 days a month.  No current facility-administered medications on file prior to visit.    BP 110/78 mmHg  Pulse 62  Temp(Src) 98.6 F (37 C) (Oral)  Resp 16  Ht 5\' 4"  (1.626 m)  Wt 114 lb (51.71 kg)  BMI 19.56 kg/m2  SpO2 99%       Objective:   Physical Exam Physical Exam  Constitutional: Thin AA female. She is oriented to person, place, and time. She appears well-developed and well-nourished. No distress.  HENT:  Head: Normocephalic and atraumatic.  Right Ear: Tympanic membrane and ear canal normal.  Left Ear: Tympanic membrane and ear canal normal.  Mouth/Throat: Oropharynx is clear and moist.  Eyes: Pupils are equal, round, and reactive to light. No scleral icterus.  Neck: Normal range of motion. No thyromegaly present.  Cardiovascular: Normal rate and regular rhythm.   No murmur heard. Pulmonary/Chest: Effort normal and breath sounds normal. No respiratory distress. He has no wheezes. She  has no rales. She exhibits no tenderness.  Abdominal: Soft. Bowel sounds are normal. He exhibits no distension and no mass. There is no tenderness. There is no rebound and no guarding.  Musculoskeletal: She exhibits no edema.  Lymphadenopathy:    She has no cervical adenopathy.  Neurological: She is alert and oriented to person, place, and time. She has normal patellar reflexes. She exhibits normal muscle tone. Coordination normal.  Skin: Skin is warm and dry.  Psychiatric: She has a normal mood and affect. Her behavior is normal. Judgment and thought content normal.    Assessment & Plan:          Assessment & Plan:

## 2014-05-26 NOTE — Patient Instructions (Signed)
Start chantix when you are ready. Follow up before you complete the 1st month of chantix. Try adding high calorie healthy snacks such as dried fruits/nuts etc. Complete lab work prior to leaving. Follow up in 1 month.

## 2014-05-26 NOTE — Progress Notes (Signed)
Pre visit review using our clinic review tool, if applicable. No additional management support is needed unless otherwise documented below in the visit note. 

## 2014-05-27 ENCOUNTER — Encounter: Payer: Self-pay | Admitting: Family

## 2014-05-27 LAB — URINALYSIS, ROUTINE W REFLEX MICROSCOPIC
Bilirubin Urine: NEGATIVE
Hgb urine dipstick: NEGATIVE
KETONES UR: NEGATIVE
Leukocytes, UA: NEGATIVE
Nitrite: NEGATIVE
RBC / HPF: NONE SEEN (ref 0–?)
Specific Gravity, Urine: <=1.005 — AB (ref 1.000–1.030)
Total Protein, Urine: NEGATIVE
URINE GLUCOSE: NEGATIVE
UROBILINOGEN UA: 0.2 (ref 0.0–1.0)
WBC, UA: NONE SEEN (ref 0–?)
pH: 5.5 (ref 5.0–8.0)

## 2014-05-27 LAB — BASIC METABOLIC PANEL
BUN: 13 mg/dL (ref 6–23)
CHLORIDE: 107 meq/L (ref 96–112)
CO2: 23 meq/L (ref 19–32)
CREATININE: 1.1 mg/dL (ref 0.4–1.2)
Calcium: 9.9 mg/dL (ref 8.4–10.5)
GFR: 66.5 mL/min (ref >60.00)
Glucose, Bld: 82 mg/dL (ref 70–99)
POTASSIUM: 3.6 meq/L (ref 3.5–5.1)
SODIUM: 139 meq/L (ref 135–145)

## 2014-05-27 LAB — HEPATIC FUNCTION PANEL
ALK PHOS: 68 U/L (ref 39–117)
ALT: 16 U/L (ref 0–35)
AST: 28 U/L (ref 0–37)
Albumin: 4.7 g/dL (ref 3.5–5.2)
BILIRUBIN DIRECT: 0 mg/dL (ref 0.0–0.3)
Total Bilirubin: 0.3 mg/dL (ref 0.2–1.2)
Total Protein: 7.4 g/dL (ref 6.0–8.3)

## 2014-05-27 LAB — CBC WITH DIFFERENTIAL/PLATELET
BASOS ABS: 0 10*3/uL (ref 0.0–0.1)
Basophils Relative: 0.5 % (ref 0.0–3.0)
EOS ABS: 0.2 10*3/uL (ref 0.0–0.7)
Eosinophils Relative: 1.9 % (ref 0.0–5.0)
HCT: 39.3 % (ref 36.0–46.0)
Hemoglobin: 12.6 g/dL (ref 12.0–15.0)
Lymphocytes Relative: 37 % (ref 12.0–46.0)
Lymphs Abs: 3.1 10*3/uL (ref 0.7–4.0)
MCHC: 32.2 g/dL (ref 30.0–36.0)
MCV: 92 fl (ref 78.0–100.0)
MONOS PCT: 5.6 % (ref 3.0–12.0)
Monocytes Absolute: 0.5 10*3/uL (ref 0.1–1.0)
NEUTROS PCT: 55 % (ref 43.0–77.0)
Neutro Abs: 4.7 10*3/uL (ref 1.4–7.7)
PLATELETS: 170 10*3/uL (ref 150.0–400.0)
RBC: 4.27 Mil/uL (ref 3.87–5.11)
RDW: 14.2 % (ref 11.5–15.5)
WBC: 8.5 10*3/uL (ref 4.0–10.5)

## 2014-05-27 LAB — LIPID PANEL
CHOL/HDL RATIO: 3
Cholesterol: 191 mg/dL (ref 0–200)
HDL: 70.7 mg/dL (ref >39.00)
LDL CALC: 106 mg/dL — AB (ref 0–99)
NonHDL: 120.3
TRIGLYCERIDES: 72 mg/dL (ref 0.0–149.0)
VLDL: 14.4 mg/dL (ref 0.0–40.0)

## 2014-05-27 LAB — TSH: TSH: 1.48 u[IU]/mL (ref 0.35–4.50)

## 2014-05-28 ENCOUNTER — Telehealth: Payer: Self-pay | Admitting: Family

## 2014-05-28 NOTE — Telephone Encounter (Signed)
Pt requesting a refill ALPRAZolam (XANAX) 0.5 MG tablet

## 2014-05-29 NOTE — Telephone Encounter (Signed)
Xanax Last filled:  03/17/14 Amt: 30, 0 refills UDS 03/17/14--HIGH risk No contract on file.   Please advise.

## 2014-05-29 NOTE — Telephone Encounter (Signed)
Ok to send 30 tabs zero refills. 

## 2014-05-30 MED ORDER — ALPRAZOLAM 0.5 MG PO TABS: ORAL_TABLET | ORAL | Status: DC

## 2014-05-30 NOTE — Telephone Encounter (Signed)
Rx called to pharmacy voicemail. Left message on pt's voicemail that request has been completed and to call if any questions.

## 2014-06-01 DIAGNOSIS — Z0000 Encounter for general adult medical examination without abnormal findings: Secondary | ICD-10-CM | POA: Insufficient documentation

## 2014-06-01 NOTE — Assessment & Plan Note (Signed)
Discussed increasing her calories due to her extensive exercise and weight loss.  Will obtain routine lab work. Pap and mammo up to date per gyn. Discussed importance of quitting smoking.

## 2014-07-07 ENCOUNTER — Ambulatory Visit: Payer: Federal, State, Local not specified - PPO | Admitting: Family

## 2014-07-07 ENCOUNTER — Telehealth: Payer: Self-pay | Admitting: *Deleted

## 2014-07-07 NOTE — Telephone Encounter (Signed)
Pt cancelled appointment 07/07/14 at 5:30pm for 1 month follow up, rescheduled for 08/08/14.  Pt will not be charged no show fee due to weather.

## 2014-07-11 ENCOUNTER — Telehealth: Payer: Self-pay | Admitting: Family

## 2014-07-11 MED ORDER — ALPRAZOLAM 0.5 MG PO TABS: ORAL_TABLET | ORAL | Status: DC

## 2014-07-11 NOTE — Telephone Encounter (Signed)
Notified pt. 

## 2014-07-11 NOTE — Telephone Encounter (Signed)
Last Rx 05/31/15, #30 x no refills. Pt has f/u 08/08/14.  Rx printed and forwarded to PRovider for signature.

## 2014-07-11 NOTE — Telephone Encounter (Signed)
Caller name:Willamson, [name] Relation to NW:GNFApt:self Call back number:[phone] Pharmacy:[facility],   Reason for call: pt is needing rx ALPRAZolam (XANAX) 0.5 MG tablet .

## 2014-07-11 NOTE — Telephone Encounter (Signed)
Rx faxed to pharmacy  

## 2014-07-18 ENCOUNTER — Telehealth: Payer: Self-pay | Admitting: Family

## 2014-07-18 MED ORDER — MELOXICAM 7.5 MG PO TABS: 7.5000 mg | ORAL_TABLET | Freq: Every day | ORAL | Status: DC

## 2014-07-18 NOTE — Telephone Encounter (Signed)
Caller name: Bernadene PersonWilliamson, Shelagh B Relation to pt: self  Call back number: [phone] [id number] [facility] [address] [facility] [address] [address] [phone] (Fax)     Reason for call: pt requesting a refill gabapentin (NEURONTIN) 300 MG capsule & meloxicam (MOBIC) 7.5 MG tablet

## 2014-07-18 NOTE — Telephone Encounter (Signed)
Refills sent. Left message on voicemail re: completion and to call if any questions.

## 2014-08-08 ENCOUNTER — Ambulatory Visit: Payer: Federal, State, Local not specified - PPO | Admitting: Family

## 2014-08-15 ENCOUNTER — Ambulatory Visit: Payer: Federal, State, Local not specified - PPO | Admitting: Family

## 2014-09-12 ENCOUNTER — Telehealth: Payer: Self-pay | Admitting: Family

## 2014-09-12 MED ORDER — ALPRAZOLAM 0.5 MG PO TABS: ORAL_TABLET | ORAL | Status: DC

## 2014-09-12 MED ORDER — FLUTICASONE PROPIONATE 50 MCG/ACT NA SUSP: NASAL | Status: DC

## 2014-09-12 NOTE — Telephone Encounter (Signed)
Last Alprazolam Rx 07/11/14, #30 x no refills. CSC on file and UDS up to date. Rx printed and forwarded to covering Provider Drue Novel(Paz) for signature.

## 2014-09-12 NOTE — Telephone Encounter (Signed)
Caller name: Cyara Relation to pt: self Call back number: [phone] [facility] [address]  Reason for call:   Requesting alprazolam and flonase refill.

## 2014-09-15 NOTE — Telephone Encounter (Signed)
Rx was faxed to pharmacy on 09/12/14 at 4pm.

## 2014-09-19 ENCOUNTER — Other Ambulatory Visit (HOSPITAL_COMMUNITY)
Admission: RE | Admit: 2014-09-19 | Discharge: 2014-09-19 | Disposition: A | Discharge status: Home / Self Care | Payer: Federal, State, Local not specified - PPO | Source: Ambulatory Visit | Attending: Family | Admitting: Family

## 2014-09-19 ENCOUNTER — Encounter: Payer: Self-pay | Admitting: Family

## 2014-09-19 ENCOUNTER — Ambulatory Visit (INDEPENDENT_AMBULATORY_CARE_PROVIDER_SITE_OTHER): Payer: Federal, State, Local not specified - PPO | Admitting: Family

## 2014-09-19 VITALS — BP 118/80 | HR 61 | Temp 97.6°F | Resp 16 | Ht 64.0 in | Wt 120.0 lb

## 2014-09-19 DIAGNOSIS — N898 Other specified noninflammatory disorders of vagina: Secondary | ICD-10-CM

## 2014-09-19 DIAGNOSIS — F411 Generalized anxiety disorder: Secondary | ICD-10-CM | POA: Diagnosis not present

## 2014-09-19 DIAGNOSIS — Z113 Encounter for screening for infections with a predominantly sexual mode of transmission: Secondary | ICD-10-CM | POA: Insufficient documentation

## 2014-09-19 DIAGNOSIS — N760 Acute vaginitis: Secondary | ICD-10-CM | POA: Insufficient documentation

## 2014-09-19 DIAGNOSIS — Z720 Tobacco use: Secondary | ICD-10-CM | POA: Insufficient documentation

## 2014-09-19 DIAGNOSIS — J309 Allergic rhinitis, unspecified: Secondary | ICD-10-CM | POA: Diagnosis not present

## 2014-09-19 NOTE — Assessment & Plan Note (Signed)
Reinforced importance of smoking cessation.

## 2014-09-19 NOTE — Assessment & Plan Note (Signed)
Deteriorated, recommend that she start claritin.

## 2014-09-19 NOTE — Progress Notes (Signed)
Subjective:    Patient ID: Christie [name], female    DOB: [birth year], 49 y.o.   MRN: [id number]  HPI  Christie [name] is a 49 yr old female who presents today for follow up.  1) Anxiety- takes 1/2 tab by mouth once daily a day at bedtime.  Notes that during th day she feels fine.    2) Tobacco abuse- did not start chantix, wants to start.  Smoking 7-10 a day.  3) Weight loss- has added some higher calorie snacks and has gained 6 pounds.    4) HA- rhinorrhea/sneezing x 1 week.  Sinus pressure, no body aches or fever.   5) vaginal discharge x 1 month.  No new partners.    Review of Systems See HPI  Past Medical History  Diagnosis Date  . Anxiety     History   Social History  . Marital Status: Married    Spouse Name: N/A  . Number of Children: N/A  . Years of Education: N/A   Occupational History  . Not on file.   Social History Main Topics  . Smoking status: Current Every Day Smoker    Types: Cigarettes  . Smokeless tobacco: Never Used     Comment: 10 cigarettes daily  . Alcohol Use: 1.2 - 1.8 oz/week    2-3 Glasses of wine per week  . Drug Use: Yes    Special: Marijuana  . Sexual Activity: Not on file   Other Topics Concern  . Not on file   Social History Narrative    Past Surgical History  Procedure Laterality Date  . Hysteroscopy  2010     per patient  . Ablation  2010    uterine    Family History  Problem Relation Age of Onset  . Alcohol abuse      family hx  . Anxiety disorder      family hx  . Arthritis      family hx  . Hypertension      family hx  . Thyroid disease      family hx  . Hypertension Mother   . Diabetes Maternal Grandmother     type II    No Known Allergies  Current Outpatient Prescriptions on File Prior to Visit  Medication Sig Dispense Refill  . ALPRAZolam (XANAX) 0.5 MG tablet TAKE 1 TABLET TWICE A DAY AS NEEDED 30 tablet 0  . CALCIUM-VITAMIN D PO Take by mouth daily.    . cyclobenzaprine (FLEXERIL)  10 MG tablet Take 1 tablet (10 mg total) by mouth 2 (two) times daily as needed for muscle spasms. 30 tablet 3  . fluticasone (FLONASE) 50 MCG/ACT nasal spray USE 2 SPRAYS INTO EACH NOSTRIL DAILY. 16 g 1  . gabapentin (NEURONTIN) 300 MG capsule TAKE 1 CAPSULE BY MOUTH AT BEDTIME 30 capsule 2  . meloxicam (MOBIC) 7.5 MG tablet Take 1 tablet (7.5 mg total) by mouth daily. 14 tablet 1  . MINIVELLE 0.05 MG/24HR patch Place 1 patch onto the skin 2 (two) times a week.    . progesterone (PROMETRIUM) 200 MG capsule Take 1 capsule by mouth x 12 days a month.    . varenicline (CHANTIX STARTING MONTH PAK) 0.5 MG X 11 & 1 MG X 42 tablet Take one 0.5 mg tablet by mouth once daily for 3 days, then increase to one 0.5 mg tablet twice daily for 4 days, then increase to one 1 mg tablet twice daily. (Patient not taking: Reported on 09/19/2014)  53 tablet 0   No current facility-administered medications on file prior to visit.    BP 118/80 mmHg  Pulse 61  Temp(Src) 97.6 F (36.4 C) (Oral)  Resp 16  Ht 5\' 4"  (1.626 m)  Wt 120 lb (54.432 kg)  BMI 20.59 kg/m2  SpO2 99%       Objective:   Physical Exam  Constitutional: She is oriented to person, place, and time. She appears well-developed and well-nourished.  HENT:  Head: Normocephalic and atraumatic.  Cardiovascular: Normal rate, regular rhythm and normal heart sounds.   No murmur heard. Pulmonary/Chest: Effort normal and breath sounds normal. No respiratory distress. She has no wheezes.  Musculoskeletal: She exhibits no edema.  Neurological: She is alert and oriented to person, place, and time.  Psychiatric: She has a normal mood and affect. Her behavior is normal. Judgment and thought content normal.  Pelvic:  Some thin white discharge is noted. Normal cervix/vagina noted        Assessment & Plan:

## 2014-09-19 NOTE — Patient Instructions (Signed)
Complete lab work prior to leaving (UDS) Please work on quitting smoking. Start claritin 10mg  once daily.  Follow up in 6 months, sooner if problems/concerns.

## 2014-09-19 NOTE — Assessment & Plan Note (Signed)
GC/Chlamydia, wet prep today.

## 2014-09-19 NOTE — Progress Notes (Signed)
Pre visit review using our clinic review tool, if applicable. No additional management support is needed unless otherwise documented below in the visit note. 

## 2014-09-19 NOTE — Addendum Note (Signed)
Addended by: Mervin KungFERGERSON, Vanissa Strength A on: 09/19/2014 02:35 PM   Modules accepted: Orders

## 2014-09-19 NOTE — Assessment & Plan Note (Signed)
Stable on prn xanax, continue same, obtain follow up UDS

## 2014-09-22 ENCOUNTER — Encounter: Payer: Self-pay | Admitting: Family

## 2014-09-22 LAB — CERVICOVAGINAL ANCILLARY ONLY
CHLAMYDIA, DNA PROBE: NEGATIVE
NEISSERIA GONORRHEA: NEGATIVE
WET PREP (BD AFFIRM): NEGATIVE

## 2014-09-23 ENCOUNTER — Encounter: Payer: Self-pay | Admitting: Family

## 2014-09-26 ENCOUNTER — Encounter: Payer: Self-pay | Admitting: Family

## 2014-10-23 ENCOUNTER — Other Ambulatory Visit: Payer: Self-pay | Admitting: Family

## 2014-11-07 ENCOUNTER — Other Ambulatory Visit: Payer: Self-pay | Admitting: Internal Medicine

## 2014-11-07 NOTE — Telephone Encounter (Signed)
Pt last seen 09/2014 and needs f/u 03/2015. Current CSC and UDS (moderate).  Rx printed and forwarded to Provider for signature.

## 2014-11-07 NOTE — Telephone Encounter (Signed)
Rx faxed to pharmacy  

## 2014-11-23 ENCOUNTER — Other Ambulatory Visit: Payer: Self-pay | Admitting: Family

## 2015-01-08 ENCOUNTER — Telehealth: Payer: Self-pay | Admitting: Family

## 2015-01-08 NOTE — Telephone Encounter (Signed)
Please call pt cell when RX is ready. [facility] [address] Needs refill on Xanax. She is out of medication. Takes 1 each night before bed.

## 2015-01-08 NOTE — Telephone Encounter (Signed)
Needs to provide UDS then can call in refill.

## 2015-01-08 NOTE — Telephone Encounter (Signed)
Unable to reach patient.  Will try to call back tomorrow.

## 2015-01-08 NOTE — Telephone Encounter (Signed)
Last filled:  11/07/14 Amt: 30, 0  Last OV:  09/19/14  Contract on fille UDS:  HIGH risk  Please advise.

## 2015-01-09 ENCOUNTER — Other Ambulatory Visit: Payer: Self-pay | Admitting: Family Medicine

## 2015-01-09 NOTE — Telephone Encounter (Signed)
Last refill: 11/07/14- 30 with 0 LOV 09/19/14 for anxiety  UDS- 05/27/14 high risk  Please advise

## 2015-01-10 NOTE — Telephone Encounter (Signed)
Needs UDS.  OK to print rx and give to her when she completes UDS.

## 2015-01-12 NOTE — Telephone Encounter (Signed)
Tried calling patient again.  No answer.  Mailbox full, therefore unable to leave a message.  Will close encounter until patient calls back.

## 2015-01-12 NOTE — Telephone Encounter (Signed)
Patient will call back to inform office when she will be able to come in and drop off UDS.

## 2015-01-13 NOTE — Telephone Encounter (Signed)
Per 01/08/15 phone note pt was informed of this on 01/12/15 and stated she would have to call us back when she could come in for UDS.  Rx will not be printed until pt calls [place] back with date she can complete UDS. Also faxed response to pharmacy to cancel current request; pending lab test as they have sent [place] 3 requests on this Rx.

## 2015-02-17 ENCOUNTER — Telehealth: Payer: Self-pay | Admitting: *Deleted

## 2015-02-17 MED ORDER — FLUTICASONE PROPIONATE 50 MCG/ACT NA SUSP: NASAL | Status: DC

## 2015-02-17 NOTE — Telephone Encounter (Signed)
[facility] flut[facility]asone spray. Refill sent. Pt due for f/u 03/21/15, please call pt to arrange appt.

## 2015-02-18 NOTE — Telephone Encounter (Signed)
LVM advising patient to schedule appointment  °

## 2015-03-11 ENCOUNTER — Other Ambulatory Visit: Payer: Self-pay | Admitting: Family

## 2015-03-11 NOTE — Telephone Encounter (Signed)
30 day supply of Gabapentin sent to pharmacy. Pt is due for 6 month follow up in October.  Please call pt to arrange appointment. Thanks!

## 2015-03-12 NOTE — Telephone Encounter (Signed)
Left msg for pt to call in and schedule f/u in October.

## 2015-03-13 ENCOUNTER — Encounter: Payer: Self-pay | Admitting: *Deleted

## 2015-03-16 NOTE — Telephone Encounter (Signed)
Letter mailed to pt.  

## 2015-03-17 ENCOUNTER — Encounter (HOSPITAL_COMMUNITY): Payer: Self-pay | Admitting: Emergency Medicine

## 2015-03-17 ENCOUNTER — Emergency Department (HOSPITAL_COMMUNITY)
Admission: EM | Admit: 2015-03-17 | Discharge: 2015-03-17 | Disposition: A | Discharge status: Home / Self Care | Payer: Federal, State, Local not specified - PPO | Attending: Emergency Medicine | Admitting: Emergency Medicine

## 2015-03-17 DIAGNOSIS — F419 Anxiety disorder, unspecified: Secondary | ICD-10-CM | POA: Diagnosis not present

## 2015-03-17 DIAGNOSIS — Z720 Tobacco use: Secondary | ICD-10-CM | POA: Insufficient documentation

## 2015-03-17 DIAGNOSIS — Z791 Long term (current) use of non-steroidal anti-inflammatories (NSAID): Secondary | ICD-10-CM | POA: Diagnosis not present

## 2015-03-17 DIAGNOSIS — Z79899 Other long term (current) drug therapy: Secondary | ICD-10-CM | POA: Insufficient documentation

## 2015-03-17 DIAGNOSIS — M545 Low back pain: Secondary | ICD-10-CM | POA: Diagnosis present

## 2015-03-17 DIAGNOSIS — M549 Dorsalgia, unspecified: Secondary | ICD-10-CM

## 2015-03-17 DIAGNOSIS — Z7951 Long term (current) use of inhaled steroids: Secondary | ICD-10-CM | POA: Diagnosis not present

## 2015-03-17 MED ORDER — METHOCARBAMOL 500 MG PO TABS
500.0000 mg | ORAL_TABLET | Freq: Once | ORAL | Status: AC
  Administered 2015-03-17: 500 mg via ORAL
  Filled 2015-03-17: qty 1

## 2015-03-17 MED ORDER — METHOCARBAMOL 500 MG PO TABS: 500.0000 mg | ORAL_TABLET | Freq: Two times a day (BID) | ORAL | Status: DC

## 2015-03-17 MED ORDER — IBUPROFEN 800 MG PO TABS
800.0000 mg | ORAL_TABLET | Freq: Once | ORAL | Status: AC
  Administered 2015-03-17: 800 mg via ORAL
  Filled 2015-03-17: qty 1

## 2015-03-17 MED ORDER — IBUPROFEN 800 MG PO TABS: 800.0000 mg | ORAL_TABLET | Freq: Three times a day (TID) | ORAL | Status: DC

## 2015-03-17 NOTE — Discharge Instructions (Signed)
1. Medications: ibuprofen, robaxin, usual home medications 2. Treatment: rest, drink plenty of fluids, heat/ice, back exercises 3. Follow Up: please followup with your primary doctor as scheduled this week for discussion of your diagnoses and further evaluation after today's visit; please return to the ER for high fever, weakness, numbness, tingling, bowel or bladder incontinence, numbness in your groin, new or worsening symptoms   Back Exercises Back exercises help treat and prevent back injuries. The goal of back exercises is to increase the strength of your abdominal and back muscles and the flexibility of your back. These exercises should be started when you no longer have back pain. Back exercises include:  Pelvic Tilt. Lie on your back with your knees bent. Tilt your pelvis until the lower part of your back is against the floor. Hold this position 5 to 10 sec and repeat 5 to 10 times.  Knee to Chest. Pull first 1 knee up against your chest and hold for 20 to 30 seconds, repeat this with the other knee, and then both knees. This may be done with the other leg straight or bent, whichever feels better.  Sit-Ups or Curl-Ups. Bend your knees 90 degrees. Start with tilting your pelvis, and do a partial, slow sit-up, lifting your trunk only 30 to 45 degrees off the floor. Take at least 2 to 3 seconds for each sit-up. Do not do sit-ups with your knees out straight. If partial sit-ups are difficult, simply do the above but with only tightening your abdominal muscles and holding it as directed.  Hip-Lift. Lie on your back with your knees flexed 90 degrees. Push down with your feet and shoulders as you raise your hips a couple inches off the floor; hold for 10 seconds, repeat 5 to 10 times.  Back arches. Lie on your stomach, propping yourself up on bent elbows. Slowly press on your hands, causing an arch in your low back. Repeat 3 to 5 times. Any initial stiffness and discomfort should lessen with  repetition over time.  Shoulder-Lifts. Lie face down with arms beside your body. Keep hips and torso pressed to floor as you slowly lift your head and shoulders off the floor. Do not overdo your exercises, especially in the beginning. Exercises may cause you some mild back discomfort which lasts for a few minutes; however, if the pain is more severe, or lasts for more than 15 minutes, do not continue exercises until you see your caregiver. Improvement with exercise therapy for back problems is slow.  See your caregivers for assistance with developing a proper back exercise program. Document Released: 07/07/2004 Document Revised: 08/22/2011 Document Reviewed: 03/31/2011 [facility] Patient Information 2015 [name]. This information is not intended to replace advice given to you by your health care provider. Make sure you discuss any questions you have with your health care provider.  Back Pain, Adult Back pain is very common. The pain often gets better over time. The cause of back pain is usually not dangerous. Most people can learn to manage their back pain on their own.  HOME CARE   Stay active. Start with short walks on flat ground if you can. Try to walk farther each day.  Do not sit, drive, or stand in one place for more than 30 minutes. Do not stay in bed.  Do not avoid exercise or work. Activity can help your back heal faster.  Be careful when you bend or lift an object. Bend at your knees, keep the object close to you,  and do not twist.  Sleep on a firm mattress. Lie on your side, and bend your knees. If you lie on your back, put a pillow under your knees.  Only take medicines as told by your doctor.  Put ice on the injured area.  Put ice in a plastic bag.  Place a towel between your skin and the bag.  Leave the ice on for 15-20 minutes, 03-04 times a day for the first 2 to 3 days. After that, you can switch between ice and heat packs.  Ask your doctor about back exercises  or massage.  Avoid feeling anxious or stressed. Find good ways to deal with stress, such as exercise. GET HELP RIGHT AWAY IF:   Your pain does not go away with rest or medicine.  Your pain does not go away in 1 week.  You have new problems.  You do not feel well.  The pain spreads into your legs.  You cannot control when you poop (bowel movement) or pee (urinate).  Your arms or legs feel weak or lose feeling (numbness).  You feel sick to your stomach (nauseous) or throw up (vomit).  You have belly (abdominal) pain.  You feel like you may pass out (faint). MAKE SURE YOU:   Understand these instructions.  Will watch your condition.  Will get help right away if you are not doing well or get worse. Document Released: 11/16/2007 Document Revised: 08/22/2011 Document Reviewed: 10/01/2013 [facility] Patient Information 2015 [place], [place]. This information is not intended to replace advice given to you by your health care provider. Make sure you discuss any questions you have with your health care provider.   Emergency Department Resource Guide 1) Find a Doctor and Pay Out of Pocket Although you won't have to find out who is covered by your insurance plan, it is a good idea to ask around and get recommendations. You will then need to call the office and see if the doctor you have chosen will accept you as a new patient and what types of options they offer for patients who are self-pay. Some doctors offer discounts or will set up payment plans for their patients who do not have insurance, but you will need to ask so you aren't surprised when you get to your appointment.  2) Contact Your Local Health Department Not all health departments have doctors that can see patients for sick visits, but many do, so it is worth a call to see if yours does. If you don't know where your local health department is, you can check in your phone book. The CDC also has a tool to help you locate your  state's health department, and many state websites also have listings of all of their local health departments.  3) Find a Walk-in Clinic If your illness is not likely to be very severe or complicated, you may want to try a walk in clinic. These are popping up all over the country in pharmacies, drugstores, and shopping centers. They're usually staffed by nurse practitioners or physician assistants that have been trained to treat common illnesses and complaints. They're usually fairly quick and inexpensive. However, if you have serious medical issues or chronic medical problems, these are probably not your best option.  No Primary Care Doctor: - Call Health Connect at  [phone] - they can help you locate a primary care doctor that  accepts your insurance, provides certain services, etc. - Physician Referral Service- [phone]  Chronic Pain Problems: Organization  Address  Phone   Notes  [facility]  [phone] Patients need to be referred by their primary care doctor.   Medication Assistance: Organization         Address  Phone   Notes  [facility] Medication [facility] [address] [place], [place] [address] [phone] --Must be a resident of [facility] -- Must have NO insurance coverage whatsoever (no Medicaid/ Medicare, etc.) -- The pt. MUST have a primary care doctor that directs their care regularly and follows them in the community   [facility]ist  [phone]   [employer]  [phone]    Agencies that provide inexpensive medical care: Organization         Address  Phone   Notes  [facility]  [phone]   [facility]    [phone]   [facility] [address] [place], [place] [address] [phone]   [facility] [address], [place] [phone]   Planned Parenthood    [phone]   [facility]    [phone]   [facility]  [address], [place] Phone:  [phone], Fax:  [phone] Hours of Operation:  9 am - 6 pm, M-F.  Also accepts Medicaid/Medicare and self-pay.  [facility]  [address], [place] Phone: [phone], Fax: [phone]. Hours of Operation:  8:30 am - 5:30 pm, M-F.  Also accepts Medicaid and self-pay.  [facility] [place] [address] [place] Phone: [phone]   [facility] [address] Natasha Bence [place], [place] [phone], Ext. [phone] Mondays & Thursdays: 7-9 AM.  First 15 patients are seen on a first come, first serve basis.    Medicaid-accepting [facility] Providers:  Organization         Address  Phone   Notes  [facility] [address], [place] [phone] Also accepts self-pay patients.  [facility] [address] Laurell Josephs [place], [place]  [phone]   [facility] [address], [place] [phone]   [facility] [address] [place] [phone]   Renaye Rakers [address], [place]   [phone] Only accepts [place] Access [place] patients after they have their name applied to their card.   Self-Pay (no insurance) in [facility]:  Organization         Address  Phone   Notes  Sickle Cell Patients, [facility] Internal Medicine [address] [place], [place] [phone]   [facility] [address] [place], [place] [phone]   [facility] [place]  [address], [place] [phone]   [facility]/Dr. Osei-Bonsu  [address], [place] or [address], [place] [phone] Phone number for both [place] and [place] locations is the same.  [facility] [address] [place] [phone]   [facility] [address], [place] or [address] [phone]   [facility] [address] [place] [phone], phone; [phone], fax Sees patients 1st and 3rd Saturday of every month.  Must not qualify  for public or private insurance (i.e. Medicaid, Medicare, [place] Health Choice, Veterans' Benefits)  Household income should be no more than 200% of the poverty level The clinic cannot treat you if you are pregnant or think you are pregnant  Sexually transmitted diseases are not treated at the clinic.    Dental Care: Organization         Address  Phone  Notes  [facility] Department of [facility] [address] [place], [place] [phone] Accepts children up to age 98 who are enrolled in [place] or [place] Health Choice; pregnant women with a Medicaid card; and children who have applied for Medicaid or [place] Health Choice, but were declined, whose parents can pay a reduced fee at time of service.  [facility] Department of [facility]  [address], [place] [phone] Accepts children up to age 3 who are enrolled in [place] or [place] Health Choice; pregnant women with a Medicaid card; and children who have applied for Medicaid or [place] Health Choice, but were declined, whose parents can pay a reduced fee at time of service.  [facility]  [address] [place], [place] [phone] Patients are seen by appointment only. Walk-ins are not accepted. [facility] will see patients 70 years of age and older. Monday - Tuesday (8am-5pm) Most Wednesdays (8:30-5pm) $30 per visit, cash only  [facility] Adult [facility]  [address], [facility] [phone] Patients are seen by appointment only. Walk-ins are not accepted. [facility] will see patients 18 years of age and older. One Wednesday Evening (Monthly: Volunteer  Based).  $30 per visit, cash only  [employer]  [phone] for adults; Children under age 75, call Graduate Pediatric Dentistry at [phone]. Children aged 41-14, please call [phone] to request a pediatric application.  Dental services are provided in all areas of dental care including fillings, crowns and bridges, complete and partial dentures, implants, gum treatment, root canals, and extractions. Preventive care is also provided. Treatment is provided to both adults and children. Patients are selected via a lottery and there is often a waiting list.   [facility] [address], [place]  [phone] [email or web address]   [facility] [address] [place], [place] [phone], Ext. [phone] Second and Fourth Thursday of each month, opens at 6:30 AM; Clinic ends at 9 AM.  Patients are seen on a first-come first-served basis, and a limited number are seen during each clinic.   [facility]  [address] Ether Griffins [place], [place] [phone]   Eligibility Requirements You must have lived in [place], [place], or [place] counties for at least the last three months.   You cannot be eligible for state or federal sponsored [place], including [employer], [place], or [employer].   You generally cannot be eligible for healthcare insurance through your employer.    How to apply: Eligibility screenings are held every Tuesday and Wednesday afternoon from 1:00 pm until 4:00 pm. You do not need an appointment for the interview!  [facility] [address] [place], [place] [phone]   [facility] Health Department  [phone]   [facility] Health Department  [phone]   [facility] Health Department  [phone]    Behavioral Health Resources in the Community: Intensive Outpatient Programs Organization         Address  Phone  Notes  [facility] [address], [place], [place] [phone]   [facility] [address] [place], [place] [phone]   [facility] [address], [place], [place]  [phone]   [facility] Mental Health [address]  [place], [place] [phone] or [phone]   Substance Abuse Resources Organization         Address  Phone  Notes  [facility]  [phone]   [facility]  [phone]   [facility] [place]  [phone]   Floydene Flock  [phone]   Residential & Outpatient Substance Abuse Program  [phone]   Psychological Services Organization         Address  Phone  Notes  [facility] Behavioral Health  [phone]   [facility] Services  [phone]   [facility] Mental Health [address] [place] [phone] or [phone]    Mobile Crisis Teams Organization         Address  Phone  Notes  [facility], Mobile Crisis Care Unit  [phone]   [facility]  [address] [place], [place] [phone]   Doristine Locks [address] [place] [phone]    Self-Help/Support Groups Organization         Address  Phone             Notes  [facility] [place] - variety of support groups  336- I7437963 Call for more information  Narcotics Anonymous (NA), [facility] [address], [employer] [place]  2 meetings at this location   Statistician         Address  Phone  Notes  [facility] [address],    [place]  [phone]   [facility]  [address], [place] [address], [place], [place] [phone]   [facility] [address] [place], [place] [phone] Admissions: 8am-3pm M-F  [facility] [address]    [place], [place] [phone]   [facility] [address] [place], [place], [place] [phone]    [facility] [address],  [place], [place] [phone]   [facility] - Intensive Outpatient [address]urell Joseph[address], [place], [place] [phone]   [facility] ([facility] [address] [place]  [place], [place] [phone] or [phone]   [facility] ([facility]) [address] [place], [place] [phone] Accepts Medicaid  [facility] [address],  [place] [phone] Substance Abuse/Addiction Treatment   [facility] Organization         Address  Phone  Notes  [facility]  5096799683   Angie Fava, PhD [address] Ervin Knack [place], [place]   6621048678 or [phone]   [facility] Behavioral   [address] [place], [place] [phone]   [facility] [address], [place], [place] [phone] Insurance/Medicaid/sponsorship through [facility] [address]                                    [place], [place] [phone] Therapy/tele-psych/case  [facility] [address] [place], [place] [phone]    Dr. Lolly Mustache  [phone]   [facility]  [address]  [facility] 1) [address], [place] 2) [address], [place] 3)  [address], [place] [phone]) [phone]  [phone]) [phone]   [place] Child Abuse Hotline [phone] [phone](336) 737-159-7130 or [phone](336) [phone] (After Hours)

## 2015-03-17 NOTE — ED Notes (Signed)
Pt reports 2 week hx of low back pain, radiating to the right. Denies leg pain. Tx with massage and motrin

## 2015-03-17 NOTE — ED Provider Notes (Signed)
CSN: [id number]     Arrival date & time 03/17/15  1035 History   First MD Initiated Contact with Patient 03/17/15 1050     Chief Complaint  Patient presents with  . Back Pain    2 week hx of low back pain     HPI   Christie [name] is a 49 y.o. female with a PMH of anxiety who presents to the ED with back pain for the past 2 weeks. 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 She denies recent injury. She reports her pain is in her right lower back, radiates to her hip, and is constant. She states movement exacerbates her pain. She has tried massage, heat, and over-the-counter pain medicine for symptom relief, which has been somewhat effective. She states this has never happened to her before. She denies fever, chills, weakness, numbness, paresthesia, difficulty ambulating, history of cancer, anticoagulant use, IV drug use, bowel or bladder incontinence, saddle anesthesia.   Past Medical History  Diagnosis Date  . Anxiety    Past Surgical History  Procedure Laterality Date  . Hysteroscopy  2010     per patient  . Ablation  2010    uterine   Family History  Problem Relation Age of Onset  . Alcohol abuse      family hx  . Anxiety disorder      family hx  . Arthritis      family hx  . Hypertension      family hx  . Thyroid disease      family hx  . Hypertension Mother   . Heart failure Mother   . Diabetes Maternal Grandmother     type II   Social History  Substance Use Topics  . Smoking status: Current Every Day Smoker    Types: Cigarettes  . Smokeless tobacco: Never Used     Comment: 10 cigarettes daily  . Alcohol Use: 0.6 oz/week    1 Glasses of wine per week     Comment: daily   OB History    No data available      Review of Systems  Constitutional: Negative for fever and chills.  Respiratory: Negative for shortness of breath.   Cardiovascular: Negative for chest pain.  Gastrointestinal: Negative for nausea,  vomiting, diarrhea and constipation.  Genitourinary: Negative for dysuria, urgency, frequency and flank pain.  Musculoskeletal: Positive for back pain and arthralgias. Negative for gait problem, neck pain and neck stiffness.  Neurological: Negative for dizziness, syncope, weakness, light-headedness, numbness and headaches.  All other systems reviewed and are negative.     Allergies  Review of patient's allergies indicates no known allergies.  Home Medications   Prior to Admission medications   Medication Sig Start Date End Date Taking? Authorizing Provider  ALPRAZolam Prudy Feeler) 0.5 MG tablet TAKE 1 TABLET BY MOUTH TWICE A DAY AS NEEDED 11/07/14   Sandford Craze, NP  CALCIUM-VITAMIN D PO Take by mouth daily.    Historical Provider, MD  cyclobenzaprine (FLEXERIL) 10 MG tablet Take 1 tablet (10 mg total) by mouth 2 (two) times daily as needed for muscle spasms. 06/10/11   Edwyna Perfect, MD  fluticasone (FLONASE) 50 MCG/ACT nasal spray USE 2 SPRAYS IN TO EACH NOSTRIL DAILY AS DIRECTED 02/17/15   Sandford Craze, NP  gabapentin (NEURONTIN) 300 MG capsule TAKE 1 CAPSULE BY MOUTH AT BEDTIME 03/11/15   Sandford Craze, NP  meloxicam (MOBIC) 7.5 MG tablet Take 1 tablet (  7.5 mg total) by mouth daily. 07/18/14   Sandford Craze, NP  MINIVELLE 0.05 MG/24HR patch Place 1 patch onto the skin 2 (two) times a week. 02/05/13   Historical Provider, MD  progesterone (PROMETRIUM) 200 MG capsule Take 1 capsule by mouth x 12 days a month. 05/13/13   Historical Provider, MD  varenicline (CHANTIX STARTING MONTH PAK) 0.5 MG X 11 & 1 MG X 42 tablet Take one 0.5 mg tablet by mouth once daily for 3 days, then increase to one 0.5 mg tablet twice daily for 4 days, then increase to one 1 mg tablet twice daily. Patient not taking: Reported on 09/19/2014 05/26/14   Sandford Craze, NP    BP 135/78 mmHg  Pulse 56  Temp(Src) 98.1 F (36.7 C) (Oral)  Resp 18  Wt 115 lb (52.164 kg)  SpO2 100% Physical Exam   Constitutional: She is oriented to person, place, and time. She appears well-developed and well-nourished. No distress.  HENT:  Head: Normocephalic and atraumatic.  Right Ear: External ear normal.  Left Ear: External ear normal.  Nose: Nose normal.  Mouth/Throat: Uvula is midline, oropharynx is clear and moist and mucous membranes are normal.  Eyes: Conjunctivae, EOM and lids are normal. Pupils are equal, round, and reactive to light. Right eye exhibits no discharge. Left eye exhibits no discharge. No scleral icterus.  Neck: Normal range of motion. Neck supple.  Cardiovascular: Normal rate, regular rhythm, normal heart sounds, intact distal pulses and normal pulses.   Pulmonary/Chest: Effort normal and breath sounds normal. No respiratory distress.  Abdominal: Soft. Normal appearance and bowel sounds are normal. She exhibits no distension and no mass. There is no tenderness. There is no rigidity, no rebound and no guarding.  Musculoskeletal: Normal range of motion. She exhibits no edema or tenderness.  Mild tenderness to palpation of right lumbar paraspinal muscles. No midline tenderness, step-off, or deformity. No TTP of right hip. Full range of motion lower extremities bilaterally. Distal pulses intact.  Neurological: She is alert and oriented to person, place, and time. She has normal strength and normal reflexes. No sensory deficit.  Strength and sensation intact. Deep tendon reflexes intact. Patient ambulates without difficulty.  Skin: Skin is warm, dry and intact. No rash noted. She is not diaphoretic. No erythema. No pallor.  Psychiatric: She has a normal mood and affect. Her speech is normal and behavior is normal. Judgment and thought content normal.  Nursing note and vitals reviewed.   ED Course  Procedures (including critical care time)  Labs Review Labs Reviewed - No data to display  Imaging Review No results found.    EKG Interpretation None      MDM   Final  diagnoses:  Back pain, unspecified location    49 year old female presents with right-sided low back pain radiating to her right hip for the past 2 weeks. 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 Denies fever, chills, weakness, numbness, paresthesia, difficulty ambulating, history of cancer, anticoagulant use, IV drug use, bowel or bladder incontinence, saddle anesthesia.   Patient is afebrile. Vital signs stable. Mild tenderness to palpation of right lumbar paraspinal muscles. No midline tenderness, step-off, or deformity. Strength and sensation intact. DTRs intact. Distal pulses intact. Full range of motion of lower extremities bilaterally. Patient ambulates without difficulty.  Symptoms likely due to muscular strain. Do not feel imaging is indicated at this time. No evidence of abscess, hematoma, or cauda equina. Will treat  with anti-inflammatory and muscle relaxant. Patient has PCP follow-up scheduled for this week. Return precautions discussed at length.  BP 135/78 mmHg  Pulse 56  Temp(Src) 98.1 F (36.7 C) (Oral)  Resp 18  Wt 115 lb (52.164 kg)  SpO2 100%      Mady Gemma, PA-C 03/17/15 1249  Mady Gemma, PA-C 03/17/15 1250  Gerhard Munch, MD 03/17/15 [phone]

## 2015-03-20 ENCOUNTER — Encounter: Payer: Self-pay | Admitting: Family

## 2015-03-20 ENCOUNTER — Ambulatory Visit (INDEPENDENT_AMBULATORY_CARE_PROVIDER_SITE_OTHER): Payer: Federal, State, Local not specified - PPO | Admitting: Family

## 2015-03-20 VITALS — BP 126/80 | HR 84 | Temp 98.3°F | Resp 16 | Ht 64.0 in | Wt 117.0 lb

## 2015-03-20 DIAGNOSIS — Z23 Encounter for immunization: Secondary | ICD-10-CM

## 2015-03-20 DIAGNOSIS — Z720 Tobacco use: Secondary | ICD-10-CM | POA: Diagnosis not present

## 2015-03-20 DIAGNOSIS — R232 Flushing: Secondary | ICD-10-CM

## 2015-03-20 DIAGNOSIS — F411 Generalized anxiety disorder: Secondary | ICD-10-CM | POA: Diagnosis not present

## 2015-03-20 DIAGNOSIS — N951 Menopausal and female climacteric states: Secondary | ICD-10-CM

## 2015-03-20 MED ORDER — CITALOPRAM HYDROBROMIDE 20 MG PO TABS: ORAL_TABLET | ORAL | Status: DC

## 2015-03-20 MED ORDER — VARENICLINE TARTRATE 0.5 MG X 11 & 1 MG X 42 PO MISC: ORAL | Status: DC

## 2015-03-20 MED ORDER — ALPRAZOLAM 0.5 MG PO TABS: 0.5000 mg | ORAL_TABLET | Freq: Two times a day (BID) | ORAL | Status: DC | PRN

## 2015-03-20 NOTE — Assessment & Plan Note (Signed)
Deteriorated. Recommend trial of citalopram .  I instructed pt to start 1/2 tablet once daily for 1 week and then increase to a full tablet once daily on week two as tolerated.  We discussed common side effects such as nausea, drowsiness and weight gain.  Pt verbalizes understanding.  Plan follow up in 1 month to evaluate progress.

## 2015-03-20 NOTE — Progress Notes (Signed)
Subjective:    Patient ID: Christie [name], female    DOB: July 08, 1965, 49 y.o.   MRN: [id number]  HPI  Christie [name] is a 49 yr old female who presents today for follow up.  1) Tobacco abuse- requests another rx for chantix. Lost last rx.   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 She reports some hot flashes at night. Reports that she has trouble unwinding at the end of the day.  Feels "tense" all the time. She denies depression.  She reports that she was taking xanax 1/2 tab at night which helped her sleep. She has been out.    Review of Systems See HPI  Past Medical History  Diagnosis Date  . Anxiety     Social History   Social History  . Marital Status: Married    Spouse Name: N/A  . Number of Children: N/A  . Years of Education: N/A   Occupational History  . Not on file.   Social History Main Topics  . Smoking status: Current Every Day Smoker    Types: Cigarettes  . Smokeless tobacco: Never Used     Comment: 10 cigarettes daily  . Alcohol Use: 0.6 oz/week    1 Glasses of wine per week     Comment: daily  . Drug Use: Yes    Special: Marijuana  . Sexual Activity: Yes    Birth Control/ Protection: None   Other Topics Concern  . Not on file   Social History Narrative    Past Surgical History  Procedure Laterality Date  . Hysteroscopy  2010     per patient  . Ablation  2010    uterine    Family History  Problem Relation Age of Onset  . Alcohol abuse      family hx  . Anxiety disorder      family hx  . Arthritis      family hx  . Hypertension      family hx  . Thyroid disease      family hx  . Hypertension Mother   . Heart failure Mother   . Diabetes Maternal Grandmother     type II    No Known Allergies  Current Outpatient Prescriptions on File Prior to Visit  Medication Sig Dispense Refill  . CALCIUM-VITAMIN D PO Take by mouth daily.    . cyclobenzaprine (FLEXERIL) 10 MG tablet Take  1 tablet (10 mg total) by mouth 2 (two) times daily as needed for muscle spasms. 30 tablet 3  . fluticasone (FLONASE) 50 MCG/ACT nasal spray USE 2 SPRAYS IN TO EACH NOSTRIL DAILY AS DIRECTED 48 g 0  . gabapentin (NEURONTIN) 300 MG capsule TAKE 1 CAPSULE BY MOUTH AT BEDTIME 30 capsule 0  . ibuprofen (ADVIL,MOTRIN) 800 MG tablet Take 1 tablet (800 mg total) by mouth 3 (three) times daily. 21 tablet 0  . meloxicam (MOBIC) 7.5 MG tablet Take 1 tablet (7.5 mg total) by mouth daily. 14 tablet 1  . methocarbamol (ROBAXIN) 500 MG tablet Take 1 tablet (500 mg total) by mouth 2 (two) times daily. 20 tablet 0  . MINIVELLE 0.05 MG/24HR patch Place 1 patch onto the skin 2 (two) times a week.    . progesterone (PROMETRIUM) 200 MG capsule Take 1 capsule by mouth x 12 days a month.     No current facility-administered medications on file prior to visit.    BP 126/80 mmHg  Pulse  84  Temp(Src) 98.3 F (36.8 C) (Oral)  Resp 16  Ht  (1.626 m)  Wt 117 lb (53.071 kg)  BMI 20.07 kg/m2  SpO2 100%       Objective:   Physical Exam  Constitutional: She appears well-developed and well-nourished.  Cardiovascular: Normal rate, regular rhythm and normal heart sounds.   No murmur heard. Pulmonary/Chest: Effort normal and breath sounds normal. No respiratory distress. She has no wheezes.  Psychiatric: She has a normal mood and affect. Her behavior is normal. Judgment and thought content normal.          Assessment & Plan:  15 min spent with pt today.  >50% of this time was spent counseling pt on anxiety/depression

## 2015-03-20 NOTE — Assessment & Plan Note (Signed)
Citalopram may help with her hot flashes.

## 2015-03-20 NOTE — Progress Notes (Signed)
Pre visit review using our clinic review tool, if applicable. No additional management support is needed unless otherwise documented below in the visit note. 

## 2015-03-20 NOTE — Patient Instructions (Signed)
Please complete lab work (UDS). Start chantix, stop smoking 1 week after you start chantix. Star citalopram - 1/2 tab by mouth once daily for 1 week, then increase to a full tab once daily on week two. Follow up in 1 month, call if you experience any mood changes after starting chantix.

## 2015-03-20 NOTE — Assessment & Plan Note (Signed)
Refill provided for chantix.

## 2015-05-01 ENCOUNTER — Ambulatory Visit: Payer: Federal, State, Local not specified - PPO | Admitting: Family

## 2015-05-15 ENCOUNTER — Ambulatory Visit: Payer: Federal, State, Local not specified - PPO | Admitting: Family

## 2015-06-03 ENCOUNTER — Telehealth: Payer: Self-pay | Admitting: *Deleted

## 2015-06-03 MED ORDER — CITALOPRAM HYDROBROMIDE 20 MG PO TABS: ORAL_TABLET | ORAL | Status: DC

## 2015-06-03 NOTE — Telephone Encounter (Signed)
[facility]ital[facility]ram. Per 03/20/15 office note pt was supposed to have seen PCP in 1 month. 30 day supply sent to pharmacy, [facility] further [facility] are [facility]  Thanks!

## 2015-06-30 MED ORDER — CITALOPRAM HYDROBROMIDE 20 MG PO TABS: ORAL_TABLET | ORAL | Status: DC

## 2015-06-30 NOTE — Addendum Note (Signed)
Addended by: Mervin Kung A on: 06/30/2015 11:55 AM   Modules accepted: Orders

## 2015-06-30 NOTE — Telephone Encounter (Signed)
Pt called to schedule appt 07/10/15. She will be out of medicine in a day or two. Requesting Citalopram. [facility] [address]

## 2015-06-30 NOTE — Telephone Encounter (Signed)
7 tablets sent to pharmacy, should get her through to her appt on 07/10/15.

## 2015-07-10 ENCOUNTER — Encounter: Payer: Self-pay | Admitting: Family

## 2015-07-10 ENCOUNTER — Ambulatory Visit (INDEPENDENT_AMBULATORY_CARE_PROVIDER_SITE_OTHER): Payer: Federal, State, Local not specified - PPO | Admitting: Family

## 2015-07-10 VITALS — BP 124/84 | HR 60 | Temp 97.9°F | Resp 16 | Ht 64.0 in | Wt 116.6 lb

## 2015-07-10 DIAGNOSIS — N951 Menopausal and female climacteric states: Secondary | ICD-10-CM

## 2015-07-10 DIAGNOSIS — L309 Dermatitis, unspecified: Secondary | ICD-10-CM

## 2015-07-10 DIAGNOSIS — F411 Generalized anxiety disorder: Secondary | ICD-10-CM | POA: Diagnosis not present

## 2015-07-10 DIAGNOSIS — R232 Flushing: Secondary | ICD-10-CM

## 2015-07-10 MED ORDER — GABAPENTIN 300 MG PO CAPS: 300.0000 mg | ORAL_CAPSULE | Freq: Every day | ORAL | Status: DC

## 2015-07-10 MED ORDER — ALPRAZOLAM 0.5 MG PO TABS: 0.5000 mg | ORAL_TABLET | Freq: Two times a day (BID) | ORAL | Status: DC | PRN

## 2015-07-10 MED ORDER — BETAMETHASONE DIPROPIONATE 0.05 % EX CREA: TOPICAL_CREAM | Freq: Two times a day (BID) | CUTANEOUS | Status: DC

## 2015-07-10 MED ORDER — MELOXICAM 7.5 MG PO TABS: 7.5000 mg | ORAL_TABLET | Freq: Every day | ORAL | Status: DC

## 2015-07-10 NOTE — Progress Notes (Signed)
Pre visit review using our clinic review tool, if applicable. No additional management support is needed unless otherwise documented below in the visit note. 

## 2015-07-10 NOTE — Progress Notes (Signed)
Subjective:    Patient ID: Christie [name], female    DOB: 1965/07/06, 50 y.o.   MRN: [id number]  HPI  1) Anxiety- Pt is maintained on citalopram. She reports that citalopram is helping to "calm me down."  2) Hot flashes-  She reports that she has had "crazy dreams" since beginning celexa.   3) Rash- Reports + rash on elbows.  Using antifungal cream.    Review of Systems    see HPI  Past Medical History  Diagnosis Date  . Anxiety     Social History   Social History  . Marital Status: Married    Spouse Name: N/A  . Number of Children: N/A  . Years of Education: N/A   Occupational History  . Not on file.   Social History Main Topics  . Smoking status: Current Every Day Smoker    Types: Cigarettes  . Smokeless tobacco: Never Used     Comment: 10 cigarettes daily  . Alcohol Use: 0.6 oz/week    1 Glasses of wine per week     Comment: daily  . Drug Use: Yes    Special: Marijuana  . Sexual Activity: Yes    Birth Control/ Protection: None   Other Topics Concern  . Not on file   Social History Narrative    Past Surgical History  Procedure Laterality Date  . Hysteroscopy  2010     per patient  . Ablation  2010    uterine    Family History  Problem Relation Age of Onset  . Alcohol abuse      family hx  . Anxiety disorder      family hx  . Arthritis      family hx  . Hypertension      family hx  . Thyroid disease      family hx  . Hypertension Mother   . Heart failure Mother   . Diabetes Maternal Grandmother     type II    No Known Allergies  Current Outpatient Prescriptions on File Prior to Visit  Medication Sig Dispense Refill  . ALPRAZolam (XANAX) 0.5 MG tablet Take 1 tablet (0.5 mg total) by mouth 2 (two) times daily as needed. 30 tablet 0  . CALCIUM-VITAMIN D PO Take by mouth daily.    . citalopram (CELEXA) 20 MG tablet Take 1 tablet once daily 7 tablet 0  . fluticasone (FLONASE) 50 MCG/ACT nasal spray USE 2 SPRAYS IN TO EACH  NOSTRIL DAILY AS DIRECTED 48 g 0  . gabapentin (NEURONTIN) 300 MG capsule TAKE 1 CAPSULE BY MOUTH AT BEDTIME 30 capsule 0  . progesterone (PROMETRIUM) 200 MG capsule Take 1 capsule by mouth x 12 days a month.    . cyclobenzaprine (FLEXERIL) 10 MG tablet Take 1 tablet (10 mg total) by mouth 2 (two) times daily as needed for muscle spasms. (Patient not taking: Reported on 07/10/2015) 30 tablet 3  . meloxicam (MOBIC) 7.5 MG tablet Take 1 tablet (7.5 mg total) by mouth daily. (Patient not taking: Reported on 07/10/2015) 14 tablet 1   No current facility-administered medications on file prior to visit.    BP 124/84 mmHg  Pulse 60  Temp(Src) 97.9 F (36.6 C) (Oral)  Resp 16  Ht  (1.626 m)  Wt 116 lb 9.6 oz (52.889 kg)  BMI 20.00 kg/m2  SpO2 100%    Objective:   Physical Exam  Constitutional: She is oriented to person, place, and time. She appears well-developed and  well-nourished. No distress.  Neurological: She is alert and oriented to person, place, and time.  Skin:  Mild eczema noted on bilateral elbows.   Psychiatric: She has a normal mood and affect. Her behavior is normal. Judgment and thought content normal.          Assessment & Plan:

## 2015-07-10 NOTE — Patient Instructions (Signed)
Continue citalopram, restart gabapentin.   Continue a good moisturizer on your elbows.  You may use diprolene (steroid cream) twice daily as needed for eczema on your elbows.

## 2015-07-11 DIAGNOSIS — L309 Dermatitis, unspecified: Secondary | ICD-10-CM | POA: Insufficient documentation

## 2015-07-11 NOTE — Assessment & Plan Note (Signed)
Discussed use of good emolients. Add prn diprolene cream.

## 2015-07-11 NOTE — Assessment & Plan Note (Signed)
Stable/improved on citalopram.  Continue same.

## 2015-07-11 NOTE — Assessment & Plan Note (Signed)
Seems to have worsened some since she has been out of gabapentin, resume gabapentin. Continue citalopram.

## 2015-08-01 ENCOUNTER — Other Ambulatory Visit: Payer: Self-pay | Admitting: Family

## 2015-08-22 ENCOUNTER — Other Ambulatory Visit: Payer: Self-pay | Admitting: Family

## 2015-08-24 NOTE — Telephone Encounter (Signed)
[facility], rx filled 06/30/15, #7, 0 RF. Seen in office 07/10/15. Ok to refill? Please advise quantity.

## 2015-10-12 DIAGNOSIS — L309 Dermatitis, unspecified: Secondary | ICD-10-CM

## 2015-10-12 HISTORY — DX: Dermatitis, unspecified: L30.9

## 2015-10-16 ENCOUNTER — Ambulatory Visit (INDEPENDENT_AMBULATORY_CARE_PROVIDER_SITE_OTHER): Payer: Federal, State, Local not specified - PPO | Admitting: Family

## 2015-10-16 ENCOUNTER — Encounter: Payer: Self-pay | Admitting: Family

## 2015-10-16 VITALS — BP 115/77 | HR 64 | Temp 98.0°F | Resp 16 | Ht 64.0 in | Wt 113.2 lb

## 2015-10-16 DIAGNOSIS — R232 Flushing: Secondary | ICD-10-CM

## 2015-10-16 DIAGNOSIS — F411 Generalized anxiety disorder: Secondary | ICD-10-CM | POA: Diagnosis not present

## 2015-10-16 DIAGNOSIS — N951 Menopausal and female climacteric states: Secondary | ICD-10-CM | POA: Diagnosis not present

## 2015-10-16 DIAGNOSIS — L309 Dermatitis, unspecified: Secondary | ICD-10-CM | POA: Diagnosis not present

## 2015-10-16 MED ORDER — GABAPENTIN 300 MG PO CAPS: 300.0000 mg | ORAL_CAPSULE | Freq: Every day | ORAL | Status: DC

## 2015-10-16 MED ORDER — CITALOPRAM HYDROBROMIDE 20 MG PO TABS: ORAL_TABLET | ORAL | Status: DC

## 2015-10-16 MED ORDER — ALPRAZOLAM 0.5 MG PO TABS: 0.5000 mg | ORAL_TABLET | Freq: Two times a day (BID) | ORAL | Status: DC | PRN

## 2015-10-16 NOTE — Progress Notes (Signed)
Subjective:    Patient ID: Christie [name], female    DOB: Oct 25, 1965, 50 y.o.   MRN: [id number]  HPI  Christie [name] is a 50 yr old female who presents today for follow up.  1) Anxiety- currently maintained on citalopram.  Reports that her anxiety was well controlled on citalopram, but ran out a few weeks ago.  She reports that she uses xanax PRN.  Last dose was this AM.    2) Hot Flashes- reports that hot flashes continue. Still wakes up sweating.   3) Eczema- last visit pt was given rx for diprolene cream. Pt reports that this is helping.   Review of Systems See HPI  Past Medical History  Diagnosis Date  . Anxiety      Social History   Social History  . Marital Status: Married    Spouse Name: N/A  . Number of Children: N/A  . Years of Education: N/A   Occupational History  . Not on file.   Social History Main Topics  . Smoking status: Current Every Day Smoker    Types: Cigarettes  . Smokeless tobacco: Never Used     Comment: 10 cigarettes daily  . Alcohol Use: 0.6 oz/week    1 Glasses of wine per week     Comment: daily  . Drug Use: Yes    Special: Marijuana  . Sexual Activity: Yes    Birth Control/ Protection: None   Other Topics Concern  . Not on file   Social History Narrative    Past Surgical History  Procedure Laterality Date  . Hysteroscopy  2010     per patient  . Ablation  2010    uterine    Family History  Problem Relation Age of Onset  . Alcohol abuse      family hx  . Anxiety disorder      family hx  . Arthritis      family hx  . Hypertension      family hx  . Thyroid disease      family hx  . Hypertension Mother   . Heart failure Mother   . Diabetes Maternal Grandmother     type II    No Known Allergies  Current Outpatient Prescriptions on File Prior to Visit  Medication Sig Dispense Refill  . ALPRAZolam (XANAX) 0.5 MG tablet Take 1 tablet (0.5 mg total) by mouth 2 (two) times daily as needed. 30 tablet 0  .  betamethasone dipropionate (DIPROLENE) 0.05 % cream APPLY TOPICALLY 2 TIMES DAILY AS NEEDED 30 g 0  . CALCIUM-VITAMIN D PO Take by mouth daily.    . citalopram (CELEXA) 20 MG tablet Take 1 tablet once daily 7 tablet 0  . cyclobenzaprine (FLEXERIL) 10 MG tablet Take 1 tablet (10 mg total) by mouth 2 (two) times daily as needed for muscle spasms. 30 tablet 3  . gabapentin (NEURONTIN) 300 MG capsule Take 1 capsule (300 mg total) by mouth at bedtime. 30 capsule 5  . meloxicam (MOBIC) 7.5 MG tablet Take 1 tablet (7.5 mg total) by mouth daily. 14 tablet 1  . fluticasone (FLONASE) 50 MCG/ACT nasal spray USE 2 SPRAYS IN TO EACH NOSTRIL DAILY AS DIRECTED 48 g 0   No current facility-administered medications on file prior to visit.    BP 115/77 mmHg  Pulse 64  Temp(Src) 98 F (36.7 C) (Oral)  Resp 16  Ht  (1.626 m)  Wt 113 lb 3.2 oz (51.347 kg)  BMI  19.42 kg/m2  SpO2 100%       Objective:   Physical Exam  Constitutional: She appears well-developed and well-nourished.  Cardiovascular: Normal rate, regular rhythm and normal heart sounds.   No murmur heard. Pulmonary/Chest: Effort normal and breath sounds normal. No respiratory distress. She has no wheezes.  Psychiatric: She has a normal mood and affect. Her behavior is normal. Judgment and thought content normal.          Assessment & Plan:

## 2015-10-16 NOTE — Assessment & Plan Note (Signed)
Fair control, continue gabapentin, resume citalopram.

## 2015-10-16 NOTE — Assessment & Plan Note (Addendum)
Not as well controlled since she ran out of citalopram. Will resume. Obtain uds.

## 2015-10-16 NOTE — Patient Instructions (Signed)
Please go to the lab to complete a urine drug screen.

## 2015-10-16 NOTE — Assessment & Plan Note (Signed)
Improved with diprolene, continue sam.e

## 2015-10-16 NOTE — Progress Notes (Signed)
Pre visit review using our clinic review tool, if applicable. No additional management support is needed unless otherwise documented below in the visit note. 

## 2015-12-04 ENCOUNTER — Telehealth: Payer: Self-pay | Admitting: Family

## 2015-12-04 NOTE — Telephone Encounter (Signed)
Error

## 2015-12-04 NOTE — Telephone Encounter (Signed)
Pt called in to request a refill on her muscle relaxers. Pt says that  she don't have the bottle in front of her so she's not sure of the exact name to give. Pt says that she is only prescribed one muscle relaxer medication.    [facility]: [id number] [place], [place] [address]

## 2015-12-04 NOTE — Telephone Encounter (Signed)
Pt does not have a muscle relaxant on her medication list, so I am not sure which medication she is referring to. Please have her call with the name of medication or send MyChart message once she has access to the pill bottle.

## 2015-12-06 ENCOUNTER — Other Ambulatory Visit: Payer: Self-pay | Admitting: Family

## 2015-12-07 NOTE — Telephone Encounter (Signed)
Rx denied. Filled on (10/16/15).//AB/CMA

## 2015-12-10 ENCOUNTER — Encounter: Payer: Self-pay | Admitting: Family

## 2016-01-05 ENCOUNTER — Other Ambulatory Visit: Payer: Self-pay | Admitting: Family

## 2016-02-18 ENCOUNTER — Other Ambulatory Visit: Payer: Self-pay | Admitting: Family

## 2016-02-19 NOTE — Telephone Encounter (Signed)
Rx faxed to pharmacy  

## 2016-03-15 DIAGNOSIS — H2513 Age-related nuclear cataract, bilateral: Secondary | ICD-10-CM | POA: Diagnosis not present

## 2016-03-31 ENCOUNTER — Other Ambulatory Visit: Payer: Self-pay | Admitting: Family

## 2016-03-31 NOTE — Telephone Encounter (Signed)
Last ov 10/16/15, Last fill 10/16/15 #30 5 rf. Please advise.

## 2016-04-01 NOTE — Telephone Encounter (Signed)
1 month supply sent to her pharmacy- please contact her to arrange follow up office visit.

## 2016-04-05 NOTE — Telephone Encounter (Signed)
lvm for pt advising her to schedule an F/U appt. For medication

## 2016-05-03 ENCOUNTER — Ambulatory Visit: Payer: Federal, State, Local not specified - PPO | Admitting: Family

## 2016-05-03 DIAGNOSIS — Z1211 Encounter for screening for malignant neoplasm of colon: Secondary | ICD-10-CM | POA: Diagnosis not present

## 2016-05-03 DIAGNOSIS — D120 Benign neoplasm of cecum: Secondary | ICD-10-CM | POA: Diagnosis not present

## 2016-05-03 DIAGNOSIS — K635 Polyp of colon: Secondary | ICD-10-CM | POA: Diagnosis not present

## 2016-05-03 DIAGNOSIS — D125 Benign neoplasm of sigmoid colon: Secondary | ICD-10-CM | POA: Diagnosis not present

## 2016-05-05 ENCOUNTER — Other Ambulatory Visit: Payer: Self-pay | Admitting: Family

## 2016-05-13 ENCOUNTER — Ambulatory Visit: Payer: Federal, State, Local not specified - PPO | Admitting: Family

## 2016-05-13 ENCOUNTER — Telehealth: Payer: Self-pay | Admitting: Family

## 2016-05-13 NOTE — Telephone Encounter (Signed)
No charge. 

## 2016-05-13 NOTE — Telephone Encounter (Signed)
Patient lvm 8:55am cancelling 10:45am appointment due to sinus concerns, patient rsc 05/20/16, charge or no charge

## 2016-05-20 ENCOUNTER — Ambulatory Visit (INDEPENDENT_AMBULATORY_CARE_PROVIDER_SITE_OTHER): Payer: Federal, State, Local not specified - PPO | Admitting: Family

## 2016-05-20 ENCOUNTER — Encounter: Payer: Self-pay | Admitting: Family

## 2016-05-20 DIAGNOSIS — Z23 Encounter for immunization: Secondary | ICD-10-CM

## 2016-05-20 DIAGNOSIS — M7700 Medial epicondylitis, unspecified elbow: Secondary | ICD-10-CM

## 2016-05-20 DIAGNOSIS — F411 Generalized anxiety disorder: Secondary | ICD-10-CM | POA: Diagnosis not present

## 2016-05-20 MED ORDER — ALPRAZOLAM 0.5 MG PO TABS: 0.5000 mg | ORAL_TABLET | Freq: Two times a day (BID) | ORAL | 0 refills | Status: DC | PRN

## 2016-05-20 MED ORDER — FLUTICASONE PROPIONATE 50 MCG/ACT NA SUSP: NASAL | 5 refills | Status: DC

## 2016-05-20 NOTE — Progress Notes (Signed)
Subjective:    Patient ID: Christie [name], female    DOB: 1965/09/07, 50 y.o.   MRN: [id number]  HPI  [name] is a 50 yr old female who presents today for follow up.  1) Anxiety- She continues the citalopram. Reports that she only uses xanax about 2-3 times a week.  Sometimes uses at night.  She denies panic attacks during the day.    2)  R elbow pain- reports that this pain flares from time to time and is relieved by prn use of meloxicam.   Review of Systems  See HPI  Past Medical History:  Diagnosis Date  . Anxiety      Social History   Social History  . Marital status: Married    Spouse name: N/A  . Number of children: N/A  . Years of education: N/A   Occupational History  . Not on file.   Social History Main Topics  . Smoking status: Current Every Day Smoker    Types: Cigarettes  . Smokeless tobacco: Never Used     Comment: 10 cigarettes daily  . Alcohol use 0.6 oz/week    1 Glasses of wine per week     Comment: daily  . Drug use:     Types: Marijuana  . Sexual activity: Yes    Birth control/ protection: None   Other Topics Concern  . Not on file   Social History Narrative  . No narrative on file    Past Surgical History:  Procedure Laterality Date  . ABLATION  2010   uterine  . HYSTEROSCOPY  2010    per patient    Family History  Problem Relation Age of Onset  . Alcohol abuse      family hx  . Anxiety disorder      family hx  . Arthritis      family hx  . Hypertension      family hx  . Thyroid disease      family hx  . Hypertension Mother   . Heart failure Mother   . Diabetes Maternal Grandmother     type II    No Known Allergies  Current Outpatient Prescriptions on File Prior to Visit  Medication Sig Dispense Refill  . ALPRAZolam (XANAX) 0.5 MG tablet TAKE 1 TABLET BY MOUTH TWICE A DAY AS NEEDED 30 tablet 0  . betamethasone dipropionate (DIPROLENE) 0.05 % cream APPLY TOPICALLY 2 TIMES DAILY AS NEEDED 30 g 0    . CALCIUM-VITAMIN D PO Take by mouth daily.    . citalopram (CELEXA) 20 MG tablet TAKE 1 TABLET BY MOUTH EVERY DAY 30 tablet 1  . fluticasone (FLONASE) 50 MCG/ACT nasal spray USE 2 SPRAYS IN TO EACH NOSTRIL DAILY AS DIRECTED 48 g 0  . gabapentin (NEURONTIN) 300 MG capsule Take 1 capsule (300 mg total) by mouth at bedtime. 30 capsule 5  . meloxicam (MOBIC) 7.5 MG tablet TAKE 1 TABLET BY MOUTH ONCE DAILY 14 tablet 1   No current facility-administered medications on file prior to visit.     BP 114/84 (BP Location: Right Arm, Cuff Size: Normal)   Pulse 64   Temp 98.2 F (36.8 C) (Oral)   Resp 16   Ht 5\' 4"  (1.626 m)   Wt 114 lb 12.8 oz (52.1 kg)   SpO2 100%   BMI 19.71 kg/m    Objective:   Physical Exam  Constitutional: She is oriented to person, place, and time. She appears well-developed and  well-nourished.  HENT:  Head: Normocephalic and atraumatic.  Cardiovascular: Normal rate, regular rhythm and normal heart sounds.   No murmur heard. Pulmonary/Chest: Effort normal and breath sounds normal. No respiratory distress. She has no wheezes.  Musculoskeletal: She exhibits no edema.  Lymphadenopathy:    She has no cervical adenopathy.  Neurological: She is alert and oriented to person, place, and time.  Psychiatric: She has a normal mood and affect. Her behavior is normal. Judgment and thought content normal.          Assessment & Plan:

## 2016-05-20 NOTE — Assessment & Plan Note (Signed)
Clinically stable. Continue citalopram. Sparing use of xanax.

## 2016-05-20 NOTE — Assessment & Plan Note (Signed)
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 Rx sent for refill.

## 2016-05-20 NOTE — Progress Notes (Signed)
Pre visit review using our clinic review tool, if applicable. No additional management support is needed unless otherwise documented below in the visit note. 

## 2016-05-20 NOTE — Patient Instructions (Addendum)

## 2016-05-25 ENCOUNTER — Other Ambulatory Visit: Payer: Self-pay | Admitting: Family

## 2016-05-27 ENCOUNTER — Telehealth: Payer: Self-pay | Admitting: Family

## 2016-05-27 NOTE — Telephone Encounter (Signed)
Notified pt and she voices understanding. 

## 2016-05-27 NOTE — Telephone Encounter (Signed)
Please let pt know that her UDS tested positive for marijuana- I will no longer be able to prescribe her xanax per controlled substance contract.

## 2016-05-27 NOTE — Telephone Encounter (Signed)
Left message for pt to return my call.

## 2016-05-31 DIAGNOSIS — H04123 Dry eye syndrome of bilateral lacrimal glands: Secondary | ICD-10-CM | POA: Diagnosis not present

## 2016-06-16 ENCOUNTER — Telehealth: Payer: Self-pay | Admitting: Family

## 2016-06-16 NOTE — Telephone Encounter (Signed)
Opened in error

## 2016-06-23 ENCOUNTER — Other Ambulatory Visit: Payer: Self-pay | Admitting: Family

## 2016-07-22 ENCOUNTER — Other Ambulatory Visit: Payer: Self-pay | Admitting: Family

## 2016-08-12 DIAGNOSIS — H04123 Dry eye syndrome of bilateral lacrimal glands: Secondary | ICD-10-CM | POA: Diagnosis not present

## 2016-08-16 DIAGNOSIS — Z3202 Encounter for pregnancy test, result negative: Secondary | ICD-10-CM | POA: Diagnosis not present

## 2016-08-16 DIAGNOSIS — R350 Frequency of micturition: Secondary | ICD-10-CM | POA: Diagnosis not present

## 2016-11-19 DIAGNOSIS — H04123 Dry eye syndrome of bilateral lacrimal glands: Secondary | ICD-10-CM | POA: Diagnosis not present

## 2017-01-17 DIAGNOSIS — H04123 Dry eye syndrome of bilateral lacrimal glands: Secondary | ICD-10-CM | POA: Diagnosis not present

## 2017-01-31 DIAGNOSIS — H578 Other specified disorders of eye and adnexa: Secondary | ICD-10-CM | POA: Diagnosis not present

## 2017-03-28 DIAGNOSIS — Z79890 Hormone replacement therapy (postmenopausal): Secondary | ICD-10-CM | POA: Diagnosis not present

## 2017-03-28 DIAGNOSIS — N951 Menopausal and female climacteric states: Secondary | ICD-10-CM | POA: Diagnosis not present

## 2017-03-28 DIAGNOSIS — Z124 Encounter for screening for malignant neoplasm of cervix: Secondary | ICD-10-CM | POA: Diagnosis not present

## 2017-03-28 DIAGNOSIS — R87810 Cervical high risk human papillomavirus (HPV) DNA test positive: Secondary | ICD-10-CM | POA: Diagnosis not present

## 2017-03-28 DIAGNOSIS — Z130 Encounter for screening for diseases of the blood and blood-forming organs and certain disorders involving the immune mechanism: Secondary | ICD-10-CM | POA: Diagnosis not present

## 2017-03-28 DIAGNOSIS — Z1231 Encounter for screening mammogram for malignant neoplasm of breast: Secondary | ICD-10-CM | POA: Diagnosis not present

## 2017-03-28 DIAGNOSIS — Z01419 Encounter for gynecological examination (general) (routine) without abnormal findings: Secondary | ICD-10-CM | POA: Diagnosis not present

## 2017-03-28 DIAGNOSIS — Z1389 Encounter for screening for other disorder: Secondary | ICD-10-CM | POA: Diagnosis not present

## 2017-03-28 DIAGNOSIS — Z1151 Encounter for screening for human papillomavirus (HPV): Secondary | ICD-10-CM | POA: Diagnosis not present

## 2017-03-30 LAB — HM PAP SMEAR: HM Pap smear: NEGATIVE

## 2017-03-31 DIAGNOSIS — H40033 Anatomical narrow angle, bilateral: Secondary | ICD-10-CM | POA: Diagnosis not present

## 2017-03-31 DIAGNOSIS — H04123 Dry eye syndrome of bilateral lacrimal glands: Secondary | ICD-10-CM | POA: Diagnosis not present

## 2017-06-10 DIAGNOSIS — H04123 Dry eye syndrome of bilateral lacrimal glands: Secondary | ICD-10-CM | POA: Diagnosis not present

## 2017-07-06 DIAGNOSIS — H04123 Dry eye syndrome of bilateral lacrimal glands: Secondary | ICD-10-CM | POA: Diagnosis not present

## 2017-08-01 DIAGNOSIS — Z0000 Encounter for general adult medical examination without abnormal findings: Secondary | ICD-10-CM | POA: Diagnosis not present

## 2017-08-03 ENCOUNTER — Telehealth: Payer: Self-pay

## 2017-08-03 NOTE — Telephone Encounter (Signed)
Received medical record request from [facility], form forwarded to [place], Research officer, political partypractice administrator for scanning/emailing.

## 2017-08-08 DIAGNOSIS — N898 Other specified noninflammatory disorders of vagina: Secondary | ICD-10-CM | POA: Diagnosis not present

## 2017-08-08 DIAGNOSIS — Z113 Encounter for screening for infections with a predominantly sexual mode of transmission: Secondary | ICD-10-CM | POA: Diagnosis not present

## 2017-09-23 DIAGNOSIS — H1013 Acute atopic conjunctivitis, bilateral: Secondary | ICD-10-CM | POA: Diagnosis not present

## 2017-10-17 DIAGNOSIS — H04123 Dry eye syndrome of bilateral lacrimal glands: Secondary | ICD-10-CM | POA: Diagnosis not present

## 2017-12-29 DIAGNOSIS — F17210 Nicotine dependence, cigarettes, uncomplicated: Secondary | ICD-10-CM | POA: Diagnosis not present

## 2017-12-29 DIAGNOSIS — M79644 Pain in right finger(s): Secondary | ICD-10-CM | POA: Diagnosis not present

## 2018-01-09 ENCOUNTER — Ambulatory Visit (INDEPENDENT_AMBULATORY_CARE_PROVIDER_SITE_OTHER): Payer: Federal, State, Local not specified - PPO

## 2018-01-09 ENCOUNTER — Ambulatory Visit (INDEPENDENT_AMBULATORY_CARE_PROVIDER_SITE_OTHER): Payer: Federal, State, Local not specified - PPO | Admitting: Family Medicine

## 2018-01-09 ENCOUNTER — Encounter: Payer: Self-pay | Admitting: Family Medicine

## 2018-01-09 VITALS — BP 132/83 | HR 64 | Wt 116.0 lb

## 2018-01-09 DIAGNOSIS — R300 Dysuria: Secondary | ICD-10-CM

## 2018-01-09 DIAGNOSIS — M79644 Pain in right finger(s): Secondary | ICD-10-CM

## 2018-01-09 DIAGNOSIS — G8929 Other chronic pain: Secondary | ICD-10-CM

## 2018-01-09 DIAGNOSIS — Z0189 Encounter for other specified special examinations: Secondary | ICD-10-CM

## 2018-01-09 DIAGNOSIS — M25521 Pain in right elbow: Secondary | ICD-10-CM

## 2018-01-09 DIAGNOSIS — M65311 Trigger thumb, right thumb: Secondary | ICD-10-CM | POA: Diagnosis not present

## 2018-01-09 DIAGNOSIS — M79641 Pain in right hand: Secondary | ICD-10-CM | POA: Diagnosis not present

## 2018-01-09 LAB — POCT URINALYSIS DIPSTICK
Bilirubin, UA: NEGATIVE
Glucose, UA: NEGATIVE
KETONES UA: NEGATIVE
NITRITE UA: NEGATIVE
PROTEIN UA: NEGATIVE
Spec Grav, UA: 1.02 (ref 1.010–1.025)
UROBILINOGEN UA: 0.2 U/dL
pH, UA: 6 (ref 5.0–8.0)

## 2018-01-09 MED ORDER — CEPHALEXIN 500 MG PO CAPS: 500.0000 mg | ORAL_CAPSULE | Freq: Two times a day (BID) | ORAL | 0 refills | Status: DC

## 2018-01-09 MED ORDER — DICLOFENAC SODIUM 1 % TD GEL: 2.0000 g | Freq: Four times a day (QID) | TRANSDERMAL | 11 refills | Status: DC

## 2018-01-09 NOTE — Progress Notes (Signed)
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 The pain is the worst in the area of the first [facility] and MCP but she says there is pain the shoots into her forearm and up into her medial elbow. 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 She currently uses ice, heat, stretching, and tylenol which she says helps her a bit but does not completely resolve the pain. She says she has pain when she first wakes up in the morning and it stays about the same throughout the day. She has no pain in the remaining fingers. She endorses some numbness/tingling in her thumb. She says she also gets some locking in her thumb when she bends it at times.   She also says that she has been having some dysuria and urinary frequency for a few days and is concerned for a bladder infection.   Past medical history, Surgical history, Family history not pertinant except as noted below, Social history, Allergies, and medications have been entered into the medical record, reviewed, and no changes needed.   Review of Systems: No headache, visual changes, nausea, vomiting, diarrhea, constipation, dizziness, abdominal pain, skin rash, fevers, chills, night sweats, weight loss, swollen lymph nodes, body aches, joint swelling, muscle aches, chest pain, shortness of breath, mood changes, visual or auditory hallucinations.   Objective:    Vitals:   01/09/18 0847  BP: 132/83  Pulse: 64   General: Well Developed, well nourished, and in no acute distress.  Neuro/Psych: Alert and oriented x3, extra-ocular muscles intact, able to move all 4 extremities, sensation grossly intact. Skin: Warm and dry, no rashes noted.  Respiratory: Not using accessory muscles, speaking in full sentences, trachea midline.  Cardiovascular:  Pulses palpable, no extremity edema. Abdomen: Does not appear distended. MSK:  Right hand normal-appearing slight bossing at first [facility]. Tender to palpation mildly at first [facility].  Mildly tender palpation palmar MCP with nodule present with flexion of interphalangeal joint.  Triggering present. Hand strength sensation pulses and capillary refill are intact.  Right elbow normal-appearing.  Tender to palpation medial epicondyle.  Pain with resisted wrist flexion. Elbow motion and strength is intact.  Lab and Radiology Results Results for orders placed or performed in visit on 01/09/18 (from the past 72 hour(s))  POCT Urinalysis Dipstick     Status: Abnormal   Collection Time: 01/09/18 10:18 AM  Result Value Ref Range   Color, UA YELLOW    Clarity, UA CLEAR    Glucose, UA Negative Negative   Bilirubin, UA NEGATIVE    Ketones, UA NEGATIVE    Spec Grav, UA 1.020 1.010 - 1.025   Blood, UA TRACE    pH, UA 6.0 5.0 - 8.0   Protein, UA Negative Negative   Urobilinogen, UA 0.2 0.2 or 1.0 E.U./dL   Nitrite, UA NEGATIVE    Leukocytes, UA Trace (A) Negative   Appearance     Odor     Dg Elbow Complete Right  Result Date: 01/09/2018 CLINICAL DATA:  Medial RIGHT elbow pain for a few months, works as a Teacher, adult education EXAM: RIGHT ELBOW - COMPLETE 3+ VIEW COMPARISON:  None FINDINGS: Osseous mineralization normal. Joint spaces preserved. No fracture, dislocation, or bone destruction. No joint effusion. IMPRESSION: Normal exam. Electronically Signed  By: Ulyses Southward M.D.   On: 01/09/2018 11:28   Dg Hand Complete Right  Result Date: 01/09/2018 CLINICAL DATA:  RIGHT hand and thumb pain especially first CMC and first MCP joint for few months, denies specific injury, works as a Teacher, adult education EXAM: RIGHT HAND - COMPLETE 3+ VIEW COMPARISON:  None FINDINGS: Osseous mineralization normal. Joint spaces preserved. Congenital lunatotriquetral fusion. No acute fracture, dislocation, bone destruction or  significant degenerative changes identified. IMPRESSION: No significant abnormalities. Electronically Signed   By: Ulyses Southward M.D.   On: 01/09/2018 11:26  I personally (independently) visualized and performed the interpretation of the images attached in this note.   Impression and Recommendations:    Assessment and Plan: 52 y.o. female with  Right hand pain multifactorial.  Trigger thumb and first CMC pain.  Trigger thumb: Discussed options.  Offered injection.  Plan for double Band-Aid splint at interphalangeal joint and topical diclofenac gel.  If not improving next step would be tendon sheath injection at A1 pulley.  First CMC pain: Mild degenerative changes per my view of x-ray.  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  This may change her mechanics of her hand a bit but I do not think as much to do with her pain today.  Plan for diclofenac gel and home hand exercises.  If not improved next step would be first CMC injection.  Right elbow pain: Medial epicondylitis.  Plan for home exercise and stretching program. Recheck with me in about 1 month.  Return sooner if needed.  Urinary tract infection: Incidental today but present.  Empiric treatment with Keflex.  Defer further management back to PCP  CC: [name], MD Fax [phone]  Orders Placed This Encounter  Procedures  . DG Hand Complete Right    Standing Status:   Future    Number of Occurrences:   1    Standing Expiration Date:   03/13/2019    Order Specific Question:   Reason for Exam (SYMPTOM  OR DIAGNOSIS REQUIRED)    Answer:   eval pain right tumb CMC and MCP    Order Specific Question:   Is patient pregnant?    Answer:   No    Order Specific Question:   Preferred imaging location?    Answer:   [facility]    Order Specific Question:   Radiology Contrast Protocol - do NOT remove file path    Answer:   \\[email or web address]\DXFluoroContrastProtocols.pdf  . DG Elbow Complete Right     Standing Status:   Future    Number of Occurrences:   1    Standing Expiration Date:   03/13/2019    Order Specific Question:   Reason for Exam (SYMPTOM  OR DIAGNOSIS REQUIRED)    Answer:   eval right medial elbow pain    Order Specific Question:   Is patient pregnant?    Answer:   No    Order Specific Question:   Preferred imaging location?    Answer:   [facility]    Order Specific Question:   Radiology Contrast Protocol - do NOT remove file path    Answer:   \\[email or web address]\Radiant\DXFluoroContrastProtocols.pdf  . POCT Urinalysis Dipstick   Meds ordered this encounter  Medications  . diclofenac sodium (VOLTAREN) 1 % GEL    Sig: Apply 2 g topically 4 (four) times daily. To affected joint.    Dispense:  100 g    Refill:  11  . cephALEXin (KEFLEX) 500 MG capsule  Sig: Take 1 capsule (500 mg total) by mouth 2 (two) times daily.    Dispense:  14 capsule    Refill:  0    Discussed warning signs or symptoms. Please see discharge instructions. Patient expresses understanding.  I personally was present and performed or re-performed the history, physical exam and medical decision-making activities of this service and have verified that the service and findings are accurately documented in the student's note. ___________________________________________ Clementeen GrahamEvan Elyan Vanwieren M.D., ABFM., CAQSM. Primary Care and Sports Medicine Adjunct Instructor of Family Medicine  [employer]

## 2018-01-09 NOTE — Patient Instructions (Addendum)
Thank you for coming in today. Use the the diclofenac gel on the base of the thumb and the middle joint.  Use the double bandaid splint.  Recheck in 1 month.  Return sooner if needed.   Take keflex for UTI

## 2018-02-16 ENCOUNTER — Ambulatory Visit: Payer: Federal, State, Local not specified - PPO | Admitting: Family Medicine

## 2018-02-23 ENCOUNTER — Ambulatory Visit (INDEPENDENT_AMBULATORY_CARE_PROVIDER_SITE_OTHER): Payer: Federal, State, Local not specified - PPO | Admitting: Family Medicine

## 2018-02-23 VITALS — BP 120/77 | HR 67 | Ht 64.0 in | Wt 121.0 lb

## 2018-02-23 DIAGNOSIS — M65311 Trigger thumb, right thumb: Secondary | ICD-10-CM | POA: Diagnosis not present

## 2018-02-23 DIAGNOSIS — M7711 Lateral epicondylitis, right elbow: Secondary | ICD-10-CM | POA: Diagnosis not present

## 2018-02-23 NOTE — Patient Instructions (Signed)
Thank you for coming in today. Call or go to the ER if you develop a large red swollen joint with extreme pain or oozing puss.  Continue exercises and the bandaid splint.  Recheck if not getting better.  Next step may be hand therapy vs surgery.

## 2018-02-23 NOTE — Progress Notes (Signed)
Christie [name] is a 52 y.o. female who presents to [facility] today for trigger thumb, and right elbow pain.  Christie [name] seen on July 30 for right trigger thumb, right CMC thumb pain, and right lateral elbow pain.  This was thought to be due to trigger thumb, thumb CMC DJD, and right epicondylitis.  We discussed options.  She plan for conservative management with home exercise program and Band-Aid splint for trigger thumb.  She notes that her triggering is still present and still mildly painful at the palmar first MCP on her right hand and she continues to have some mild pain in the lateral elbow.  She denies any radiating pain weakness or numbness fevers or chills.  She would like to avoid surgery if possible.  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  The elbow pain is mild and not particularly bothersome.    ROS:  As above  Exam:  BP 120/77   Pulse 67   Ht 5\' 4"  (1.626 m)   Wt 121 lb (54.9 kg)   BMI 20.77 kg/m  General: Well Developed, well nourished, and in no acute distress.  Neuro/Psych: Alert and oriented x3, extra-ocular muscles intact, able to move all 4 extremities, sensation grossly intact. Skin: Warm and dry, no rashes noted.  Respiratory: Not using accessory muscles, speaking in full sentences, trachea midline.  Cardiovascular: Pulses palpable, no extremity edema. Abdomen: Does not appear distended. MSK:  Right hand normal-appearing without significant deformity.  Tender to palpation at palmar MCP right thumb with triggering with extension of IP joint.  Right elbow normal-appearing tender palpation lateral epicondyle.  Pain with resisted wrist extension.  Pulses cap refill and sensation are intact distally.    Lab and Radiology Results Procedure: Real-time Ultrasound Guided Injection of right trigger thumb Device: GE Logiq E     Images permanently stored and available for review in the ultrasound unit. Verbal informed consent obtained.  Discussed risks and benefits of procedure. Warned about infection bleeding damage to structures skin hypopigmentation and fat atrophy among others. Patient expresses understanding and agreement Time-out conducted.   Noted no overlying erythema, induration, or other signs of local infection.   Skin prepped in a sterile fashion.   Local anesthesia: Topical Ethyl chloride.   With sterile technique and under real time ultrasound guidance:  40 mg (0.355ml 80mg /741ml solution) and 0.5 mL of lidocaine injected easily.   Completed without difficulty   Pain immediately resolved suggesting accurate placement of the medication.   Advised to call if fevers/chills, erythema, induration, drainage, or persistent bleeding.   Images permanently stored and available for review in the ultrasound unit.  Impression: Technically successful ultrasound guided injection.     Assessment and Plan: 52 y.o. female with  Right trigger thumb: Failing typical conservative management.  Plan for injection today.  Continue double Band-Aid splint and conservative home exercise program.  If not better next step would either be hand therapy or referral to hand surgery.  Right lateral elbow pain lateral epicondylitis.  Reemphasized the importance of continued eccentric exercises and stretching.  Recommend Thera-Band flex bar.  Recheck if not improving.   Historical information moved to improve visibility of documentation.  Past Medical History:  Diagnosis Date  . Anxiety   . Sleep apnea    Past Surgical History:  Procedure Laterality Date  . ABLATION  2010   uterine  .  HYSTEROSCOPY  2010    per patient   Social History   Tobacco Use  . Smoking status: Current Every Day Smoker    Types: Cigarettes  . Smokeless tobacco: Never Used  . Tobacco comment: 10 cigarettes daily  Substance Use Topics  . Alcohol  use: Yes    Alcohol/week: 1.0 standard drinks    Types: 1 Glasses of wine per week    Comment: daily   family history includes Alcohol abuse in her unknown relative; Anxiety disorder in her unknown relative; Arthritis in her unknown relative; Diabetes in her maternal grandmother; Heart failure in her mother; Hypertension in her mother and unknown relative; Thyroid disease in her unknown relative.  Medications: Current Outpatient Medications  Medication Sig Dispense Refill  . betamethasone dipropionate (DIPROLENE) 0.05 % cream APPLY TOPICALLY 2 TIMES DAILY AS NEEDED 30 g 0  . CALCIUM-VITAMIN D PO Take by mouth daily.    . citalopram (CELEXA) 20 MG tablet TAKE 1 TABLET BY MOUTH EVERY DAY 30 tablet 2  . diclofenac sodium (VOLTAREN) 1 % GEL Apply 2 g topically 4 (four) times daily. To affected joint. 100 g 11  . fluticasone (FLONASE) 50 MCG/ACT nasal spray USE 2 SPRAYS IN TO EACH NOSTRIL DAILY AS DIRECTED 48 g 5  . gabapentin (NEURONTIN) 300 MG capsule TAKE 1 CAPSULE (300 MG TOTAL) BY MOUTH AT BEDTIME. 30 capsule 5  . meloxicam (MOBIC) 7.5 MG tablet TAKE 1 TABLET BY MOUTH ONCE DAILY 14 tablet 1   No current facility-administered medications for this visit.    No Known Allergies    Discussed warning signs or symptoms. Please see discharge instructions. Patient expresses understanding.

## 2018-03-03 DIAGNOSIS — M5412 Radiculopathy, cervical region: Secondary | ICD-10-CM | POA: Diagnosis not present

## 2018-04-04 DIAGNOSIS — H04123 Dry eye syndrome of bilateral lacrimal glands: Secondary | ICD-10-CM | POA: Diagnosis not present

## 2018-04-06 DIAGNOSIS — H04123 Dry eye syndrome of bilateral lacrimal glands: Secondary | ICD-10-CM | POA: Diagnosis not present

## 2018-05-01 DIAGNOSIS — Z01419 Encounter for gynecological examination (general) (routine) without abnormal findings: Secondary | ICD-10-CM | POA: Diagnosis not present

## 2018-05-01 DIAGNOSIS — N951 Menopausal and female climacteric states: Secondary | ICD-10-CM | POA: Diagnosis not present

## 2018-05-01 DIAGNOSIS — Z130 Encounter for screening for diseases of the blood and blood-forming organs and certain disorders involving the immune mechanism: Secondary | ICD-10-CM | POA: Diagnosis not present

## 2018-05-01 DIAGNOSIS — Z6821 Body mass index (BMI) 21.0-21.9, adult: Secondary | ICD-10-CM | POA: Diagnosis not present

## 2018-05-01 DIAGNOSIS — Z1231 Encounter for screening mammogram for malignant neoplasm of breast: Secondary | ICD-10-CM | POA: Diagnosis not present

## 2018-05-08 DIAGNOSIS — R05 Cough: Secondary | ICD-10-CM | POA: Diagnosis not present

## 2018-05-08 DIAGNOSIS — J019 Acute sinusitis, unspecified: Secondary | ICD-10-CM | POA: Diagnosis not present

## 2018-05-26 DIAGNOSIS — H40033 Anatomical narrow angle, bilateral: Secondary | ICD-10-CM | POA: Diagnosis not present

## 2018-05-26 DIAGNOSIS — H1013 Acute atopic conjunctivitis, bilateral: Secondary | ICD-10-CM | POA: Diagnosis not present

## 2018-08-08 DIAGNOSIS — H1013 Acute atopic conjunctivitis, bilateral: Secondary | ICD-10-CM | POA: Diagnosis not present

## 2018-08-13 DIAGNOSIS — H04123 Dry eye syndrome of bilateral lacrimal glands: Secondary | ICD-10-CM | POA: Diagnosis not present

## 2018-08-22 DIAGNOSIS — H04123 Dry eye syndrome of bilateral lacrimal glands: Secondary | ICD-10-CM | POA: Diagnosis not present

## 2018-08-24 DIAGNOSIS — H1013 Acute atopic conjunctivitis, bilateral: Secondary | ICD-10-CM | POA: Diagnosis not present

## 2018-11-13 DIAGNOSIS — H04123 Dry eye syndrome of bilateral lacrimal glands: Secondary | ICD-10-CM | POA: Diagnosis not present

## 2018-12-18 DIAGNOSIS — Z113 Encounter for screening for infections with a predominantly sexual mode of transmission: Secondary | ICD-10-CM | POA: Diagnosis not present

## 2018-12-19 DIAGNOSIS — Z113 Encounter for screening for infections with a predominantly sexual mode of transmission: Secondary | ICD-10-CM | POA: Diagnosis not present

## 2018-12-21 DIAGNOSIS — H1013 Acute atopic conjunctivitis, bilateral: Secondary | ICD-10-CM | POA: Diagnosis not present

## 2018-12-26 LAB — NOVEL CORONAVIRUS, NAA: SARS-CoV-2, NAA: NOT DETECTED

## 2018-12-28 ENCOUNTER — Encounter: Payer: Self-pay | Admitting: Hematology

## 2018-12-28 DIAGNOSIS — L821 Other seborrheic keratosis: Secondary | ICD-10-CM | POA: Diagnosis not present

## 2018-12-28 DIAGNOSIS — L309 Dermatitis, unspecified: Secondary | ICD-10-CM | POA: Diagnosis not present

## 2018-12-28 DIAGNOSIS — F17210 Nicotine dependence, cigarettes, uncomplicated: Secondary | ICD-10-CM | POA: Diagnosis not present

## 2018-12-28 DIAGNOSIS — N951 Menopausal and female climacteric states: Secondary | ICD-10-CM | POA: Diagnosis not present

## 2019-01-02 ENCOUNTER — Telehealth: Payer: Self-pay | Admitting: Family Medicine

## 2019-01-02 NOTE — Telephone Encounter (Signed)
Informed pt of negative covid results.  °

## 2019-02-05 DIAGNOSIS — H04123 Dry eye syndrome of bilateral lacrimal glands: Secondary | ICD-10-CM | POA: Diagnosis not present

## 2019-03-01 ENCOUNTER — Ambulatory Visit: Payer: Federal, State, Local not specified - PPO | Admitting: Family Medicine

## 2019-03-22 ENCOUNTER — Ambulatory Visit (INDEPENDENT_AMBULATORY_CARE_PROVIDER_SITE_OTHER): Payer: Federal, State, Local not specified - PPO | Admitting: Family Medicine

## 2019-03-22 ENCOUNTER — Other Ambulatory Visit: Payer: Self-pay

## 2019-03-22 ENCOUNTER — Encounter: Payer: Self-pay | Admitting: Family Medicine

## 2019-03-22 VITALS — BP 113/74 | HR 71 | Wt 112.0 lb

## 2019-03-22 DIAGNOSIS — M65311 Trigger thumb, right thumb: Secondary | ICD-10-CM | POA: Diagnosis not present

## 2019-03-22 DIAGNOSIS — Z23 Encounter for immunization: Secondary | ICD-10-CM | POA: Diagnosis not present

## 2019-03-22 MED ORDER — DICLOFENAC SODIUM 1 % TD GEL: 2.0000 g | Freq: Four times a day (QID) | TRANSDERMAL | 11 refills | Status: DC

## 2019-03-22 NOTE — Patient Instructions (Signed)
Thank you for coming in today. Call or go to the ER if you develop a large red swollen joint with extreme pain or oozing puss.  Use the double bandaid splint.  Recheck if not improving.  Use diclofenac gel.   I will be moving to full time Sports Medicine in [place] starting on November 1st.  You will still be able to see me for your Sports Medicine or Orthopedic needs at [facility] in [place]. I will still be part of [place].    If you want to stay locally for your Sports Medicine issues Dr. Dianah Field here in [place] will be happy to see you.  Additionally Dr. Clearance Coots at [facility] will be happy to see you for sports medicine issues more locally.   For your primary care needs you are welcome to establish care with Dr. Emeterio Reeve.  Dr Luetta Nutting will be starting in the new year and a new NP Caryl Asp will be starting in December.  We are working quickly to hire more physicians to cover the primary care needs however if you cannot get an appointment with Dr. Sheppard Coil in a timely manner [place] has locations and openings for primary care services nearby.   [facility] [address] . [employer] , [place]: [phone] . Behavioral Medicine: [phone] . Fax: [place] at [employer] [address] . [place], [place]: [phone] . Behavioral Medicine: [phone] . Fax: [phone] . Hours (M-F): 7am - Academic librarian At [facility]. [place], [place]: [phone] . Behavioral Medicine: [phone] . Fax: [phone] . Hours (M-F): 8am - Optician, dispensing at [employer] . [place], [place] Phone: [phone] . Behavioral Medicine: [phone] . Fax: [phone]

## 2019-03-22 NOTE — Progress Notes (Signed)
Christie [name] is a 53 y.o. female who presents to [facility] today for right thumb pain. Right thumb pain started 1 week ago. Was holding hand and thumb was pulled and had a pop. Tx naproxen and brace and heat and ice. Work as Geophysicist/field seismologist.   Patient was seen in July and September 2019 for thumb pain.  Thought to be due to this trigger thumb and had injection.  She notes pain is felt at the palmar aspect of the right thumb at MCP.  Pain is worse with flexion of IP joint.  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  She denies any radiating pain weakness or numbness fevers or chills.  ROS:  As above  Exam:  BP 113/74   Pulse 71   Wt 112 lb (50.8 kg)   BMI 19.22 kg/m  Wt Readings from Last 5 Encounters:  03/22/19 112 lb (50.8 kg)  02/23/18 121 lb (54.9 kg)  01/09/18 116 lb (52.6 kg)  05/20/16 114 lb 12.8 oz (52.1 kg)  10/16/15 113 lb 3.2 oz (51.3 kg)   General: Well Developed, well nourished, and in no acute distress.  Neuro/Psych: Alert and oriented x3, extra-ocular muscles intact, able to move all 4 extremities, sensation grossly intact. Skin: Warm and dry, no rashes noted.  Respiratory: Not using accessory muscles, speaking in full sentences, trachea midline.  Cardiovascular: Pulses palpable, no extremity edema. Abdomen: Does not appear distended. MSK: Right thumb relatively normal-appearing Tender palpation at palmar MCP with slight nodule pill present.  Some triggering present with flexion of IP joint.  Intact strength.  Ligament exam of thumb is stable. Pulses cap refill and station are intact distally.     Lab and Radiology Results Procedure: Real-time Ultrasound Guided Injection of right thumb A1 pulley trigger thumb injection Device: GE Logiq E   Images permanently stored and available for review in the ultrasound unit. Verbal informed consent obtained.   Discussed risks and benefits of procedure. Warned about infection bleeding damage to structures skin hypopigmentation and fat atrophy among others. Patient expresses understanding and agreement Time-out conducted.   Noted no overlying erythema, induration, or other signs of local infection.   Skin prepped in a sterile fashion.   Local anesthesia: Topical Ethyl chloride.   With sterile technique and under real time ultrasound guidance:  40 mg of Depo-Medrol and 0.5 mL of lidocaine injected easily.   Completed without difficulty   Pain immediately resolved suggesting accurate placement of the medication.   Advised to call if fevers/chills, erythema, induration, drainage, or persistent bleeding.   Images permanently stored and available for review in the ultrasound unit.  Impression: Technically successful ultrasound guided injection.         Assessment and Plan: 53 y.o. female with  Right thumb trigger finger exacerbation.  Plan for repeat injection today.  Will use double Band-Aid splint and diclofenac gel as well.  Home exercise program recheck as needed.  If not improving will reassess.  Additionally influenza vaccine given today prior to discharge.   PDMP not reviewed this encounter. No orders of the defined types were placed in this encounter.  Meds ordered this encounter  Medications  . diclofenac sodium (VOLTAREN) 1 % GEL    Sig: Apply 2 g topically 4 (four) times daily. To affected joint.    Dispense:  100 g    Refill:  11    Historical information  moved to improve visibility of documentation.  Past Medical History:  Diagnosis Date  . Anxiety   . Sleep apnea    Past Surgical History:  Procedure Laterality Date  . ABLATION  2010   uterine  . HYSTEROSCOPY  2010    per patient   Social History   Tobacco Use  . Smoking status: Current Every Day Smoker    Types: Cigarettes  . Smokeless tobacco: Never Used  . Tobacco comment: 10 cigarettes daily  Substance  Use Topics  . Alcohol use: Yes    Alcohol/week: 1.0 standard drinks    Types: 1 Glasses of wine per week    Comment: daily   family history includes Alcohol abuse in her unknown relative; Anxiety disorder in her unknown relative; Arthritis in her unknown relative; Diabetes in her maternal grandmother; Heart failure in her mother; Hypertension in her mother and unknown relative; Thyroid disease in her unknown relative.  Medications: Current Outpatient Medications  Medication Sig Dispense Refill  . betamethasone dipropionate (DIPROLENE) 0.05 % cream APPLY TOPICALLY 2 TIMES DAILY AS NEEDED 30 g 0  . CALCIUM-VITAMIN D PO Take by mouth daily.    . citalopram (CELEXA) 20 MG tablet TAKE 1 TABLET BY MOUTH EVERY DAY 30 tablet 2  . diclofenac sodium (VOLTAREN) 1 % GEL Apply 2 g topically 4 (four) times daily. To affected joint. 100 g 11  . fluticasone (FLONASE) 50 MCG/ACT nasal spray USE 2 SPRAYS IN TO EACH NOSTRIL DAILY AS DIRECTED 48 g 5  . gabapentin (NEURONTIN) 300 MG capsule TAKE 1 CAPSULE (300 MG TOTAL) BY MOUTH AT BEDTIME. 30 capsule 5  . meloxicam (MOBIC) 7.5 MG tablet TAKE 1 TABLET BY MOUTH ONCE DAILY 14 tablet 1   No current facility-administered medications for this visit.    No Known Allergies    Discussed warning signs or symptoms. Please see discharge instructions. Patient expresses understanding.

## 2019-05-11 DIAGNOSIS — H04123 Dry eye syndrome of bilateral lacrimal glands: Secondary | ICD-10-CM | POA: Diagnosis not present

## 2019-06-15 DIAGNOSIS — H1013 Acute atopic conjunctivitis, bilateral: Secondary | ICD-10-CM | POA: Diagnosis not present

## 2019-06-28 ENCOUNTER — Ambulatory Visit: Payer: Federal, State, Local not specified - PPO | Attending: Internal Medicine

## 2019-06-28 DIAGNOSIS — Z20822 Contact with and (suspected) exposure to covid-19: Secondary | ICD-10-CM | POA: Diagnosis not present

## 2019-06-29 LAB — NOVEL CORONAVIRUS, NAA: SARS-CoV-2, NAA: NOT DETECTED

## 2019-07-02 DIAGNOSIS — Z6820 Body mass index (BMI) 20.0-20.9, adult: Secondary | ICD-10-CM | POA: Diagnosis not present

## 2019-07-02 DIAGNOSIS — N951 Menopausal and female climacteric states: Secondary | ICD-10-CM | POA: Diagnosis not present

## 2019-07-02 DIAGNOSIS — Z780 Asymptomatic menopausal state: Secondary | ICD-10-CM | POA: Diagnosis not present

## 2019-07-02 DIAGNOSIS — Z01419 Encounter for gynecological examination (general) (routine) without abnormal findings: Secondary | ICD-10-CM | POA: Diagnosis not present

## 2019-07-02 DIAGNOSIS — Z113 Encounter for screening for infections with a predominantly sexual mode of transmission: Secondary | ICD-10-CM | POA: Diagnosis not present

## 2019-07-02 DIAGNOSIS — Z1231 Encounter for screening mammogram for malignant neoplasm of breast: Secondary | ICD-10-CM | POA: Diagnosis not present

## 2019-07-02 DIAGNOSIS — Z130 Encounter for screening for diseases of the blood and blood-forming organs and certain disorders involving the immune mechanism: Secondary | ICD-10-CM | POA: Diagnosis not present

## 2019-07-02 LAB — HM MAMMOGRAPHY

## 2019-07-04 ENCOUNTER — Telehealth: Payer: Self-pay

## 2019-07-04 NOTE — Telephone Encounter (Signed)
Patient needs to have an order placed to have an Covid-19 Test done at a [facility] site. Please follow up with the patient as soon as possible at [phone] thanks.

## 2019-07-05 ENCOUNTER — Ambulatory Visit: Payer: Federal, State, Local not specified - PPO | Attending: Internal Medicine

## 2019-07-05 DIAGNOSIS — Z20822 Contact with and (suspected) exposure to covid-19: Secondary | ICD-10-CM | POA: Diagnosis not present

## 2019-07-06 LAB — NOVEL CORONAVIRUS, NAA: SARS-CoV-2, NAA: NOT DETECTED

## 2019-07-10 NOTE — Telephone Encounter (Signed)
Was tested 07/05/2019

## 2019-08-05 ENCOUNTER — Ambulatory Visit: Payer: Federal, State, Local not specified - PPO | Attending: Internal Medicine

## 2019-08-05 DIAGNOSIS — Z20822 Contact with and (suspected) exposure to covid-19: Secondary | ICD-10-CM | POA: Diagnosis not present

## 2019-08-06 LAB — NOVEL CORONAVIRUS, NAA: SARS-CoV-2, NAA: NOT DETECTED

## 2019-08-23 ENCOUNTER — Ambulatory Visit: Payer: Federal, State, Local not specified - PPO | Attending: Internal Medicine

## 2019-08-23 DIAGNOSIS — Z23 Encounter for immunization: Secondary | ICD-10-CM

## 2019-08-23 NOTE — Progress Notes (Signed)
   Covid-19 Vaccination Clinic  Name:  Christie [name]    MRN: [id number] DOB: [birth year]  08/23/2019  [name] was observed post Covid-19 immunization for 15 minutes without incident. She was provided with Vaccine Information Sheet and instruction to access the V-Safe system.   [name] was instructed to call 911 with any severe reactions post vaccine: Marland Kitchen Difficulty breathing  . Swelling of face and throat  . A fast heartbeat  . A bad rash all over body  . Dizziness and weakness   Immunizations Administered    Name Date Dose VIS Date Route   Pfizer COVID-19 Vaccine 08/23/2019 12:01 PM 0.3 mL 05/24/2019 Intramuscular   Manufacturer: [employer], [employer]   Lot: [id number]   NDC: [address]-5

## 2019-09-16 ENCOUNTER — Ambulatory Visit: Payer: Federal, State, Local not specified - PPO

## 2019-09-17 ENCOUNTER — Ambulatory Visit: Payer: Federal, State, Local not specified - PPO | Attending: Internal Medicine

## 2019-09-17 ENCOUNTER — Encounter: Payer: Self-pay | Admitting: Family Medicine

## 2019-09-17 DIAGNOSIS — Z23 Encounter for immunization: Secondary | ICD-10-CM

## 2019-09-17 NOTE — Progress Notes (Signed)
   Covid-19 Vaccination Clinic  Name:  DEVYNN HESSLER    MRN: [id number] DOB: 10/02/1965  09/17/2019  Ms. Rayson was observed post Covid-19 immunization for 15 minutes without incident. She was provided with Vaccine Information Sheet and instruction to access the V-Safe system.   Ms. Conklin was instructed to call 911 with any severe reactions post vaccine: Marland Kitchen Difficulty breathing  . Swelling of face and throat  . A fast heartbeat  . A bad rash all over body  . Dizziness and weakness   Immunizations Administered    Name Date Dose VIS Date Route   Pfizer COVID-19 Vaccine 09/17/2019  3:10 PM 0.3 mL 05/24/2019 Intramuscular   Manufacturer: [employer], [employer]   Lot: [id number]   NDC: [address]-2

## 2019-10-05 DIAGNOSIS — H04123 Dry eye syndrome of bilateral lacrimal glands: Secondary | ICD-10-CM | POA: Diagnosis not present

## 2019-10-25 DIAGNOSIS — H10023 Other mucopurulent conjunctivitis, bilateral: Secondary | ICD-10-CM | POA: Diagnosis not present

## 2019-11-15 ENCOUNTER — Ambulatory Visit: Payer: Federal, State, Local not specified - PPO | Admitting: Family

## 2019-11-15 ENCOUNTER — Encounter: Payer: Self-pay | Admitting: Family

## 2019-11-15 ENCOUNTER — Other Ambulatory Visit: Payer: Self-pay

## 2019-11-15 VITALS — BP 110/72 | HR 71 | Temp 97.9°F | Resp 16 | Ht 64.0 in | Wt 129.0 lb

## 2019-11-15 DIAGNOSIS — L299 Pruritus, unspecified: Secondary | ICD-10-CM

## 2019-11-15 DIAGNOSIS — E010 Iodine-deficiency related diffuse (endemic) goiter: Secondary | ICD-10-CM | POA: Diagnosis not present

## 2019-11-15 DIAGNOSIS — R232 Flushing: Secondary | ICD-10-CM

## 2019-11-15 DIAGNOSIS — Z720 Tobacco use: Secondary | ICD-10-CM | POA: Diagnosis not present

## 2019-11-15 DIAGNOSIS — F411 Generalized anxiety disorder: Secondary | ICD-10-CM | POA: Diagnosis not present

## 2019-11-15 MED ORDER — CETIRIZINE HCL 10 MG PO TABS: 10.0000 mg | ORAL_TABLET | Freq: Every day | ORAL | 11 refills | Status: AC

## 2019-11-15 MED ORDER — BETAMETHASONE DIPROPIONATE 0.05 % EX CREA: TOPICAL_CREAM | CUTANEOUS | 1 refills | Status: DC

## 2019-11-15 MED ORDER — CITALOPRAM HYDROBROMIDE 20 MG PO TABS: ORAL_TABLET | ORAL | 0 refills | Status: DC

## 2019-11-15 NOTE — Progress Notes (Signed)
Subjective:    Patient ID: Christie [name], female    DOB: 1965/07/05, 54 y.o.   MRN: [id number]  HPI  Pt is a 54 yr old female who presents today to re-establish care.  The last time we saw her was back in 2017.  Pmhx is significant for the following:  Anxiety- no longer taking citalopram.  She does report some recent increase in anxiety. PHQ9 SCORE ONLY 11/15/2019 01/09/2018 05/20/2016  PHQ-9 Total Score 7 0 0    Tobacco abuse-she continues to smoke.  She reports that when she is working she does not typically think about cigarettes.  However soon as she gets off work and she is in her car she began smoking again.  Pruritis- has been intermittent.  She has tried benadryl.   Hot flashes- nighttime  Review of Systems Past Medical History:  Diagnosis Date   Anxiety    Sleep apnea      Social History   Socioeconomic History   Marital status: Married    Spouse name: Not on file   Number of children: Not on file   Years of education: Not on file   Highest education level: Not on file  Occupational History   Not on file  Tobacco Use   Smoking status: Current Every Day Smoker    Types: Cigarettes   Smokeless tobacco: Never Used   Tobacco comment: 10 cigarettes daily  Substance and Sexual Activity   Alcohol use: Yes    Alcohol/week: 1.0 standard drinks    Types: 1 Glasses of wine per week    Comment: daily   Drug use: Yes    Types: Marijuana   Sexual activity: Yes    Birth control/protection: None  Other Topics Concern   Not on file  Social History Narrative   Not on file   Social Determinants of Health   Financial Resource Strain:    Difficulty of Paying Living Expenses:   Food Insecurity:    Worried About Charity fundraiser in the Last Year:    Arboriculturist in the Last Year:   Transportation Needs:    Film/video editor (Medical):    Lack of Transportation (Non-Medical):   Physical Activity:    Days of Exercise per  Week:    Minutes of Exercise per Session:   Stress:    Feeling of Stress :   Social Connections:    Frequency of Communication with Friends and Family:    Frequency of Social Gatherings with Friends and Family:    Attends Religious Services:    Active Member of Clubs or Organizations:    Attends Music therapist:    Marital Status:   Intimate Partner Violence:    Fear of Current or Ex-Partner:    Emotionally Abused:    Physically Abused:    Sexually Abused:     Past Surgical History:  Procedure Laterality Date   ABLATION  2010   uterine   HYSTEROSCOPY  2010    per patient    Family History  Problem Relation Age of Onset   Hypertension Mother    Heart failure Mother    Diabetes Maternal Grandmother        type II   Alcohol abuse Unknown        family hx   Anxiety disorder Unknown        family hx   Arthritis Unknown        family hx   Hypertension  Unknown        family hx   Thyroid disease Unknown        family hx    No Known Allergies  Current Outpatient Medications on File Prior to Visit  Medication Sig Dispense Refill   CALCIUM-VITAMIN D PO Take by mouth daily.     diclofenac sodium (VOLTAREN) 1 % GEL Apply 2 g topically 4 (four) times daily. To affected joint. 100 g 11   fluticasone (FLONASE) 50 MCG/ACT nasal spray USE 2 SPRAYS IN TO EACH NOSTRIL DAILY AS DIRECTED 48 g 5   No current facility-administered medications on file prior to visit.    BP 110/72 (BP Location: Right Arm, Patient Position: Sitting, Cuff Size: Small)    Pulse 71    Temp 97.9 F (36.6 C) (Temporal)    Resp 16    Ht 5\' 4"  (1.626 m)    Wt 129 lb (58.5 kg)    SpO2 100%    BMI 22.14 kg/m       Objective:   Physical Exam Constitutional:      Appearance: She is well-developed.  Neck:     Thyroid: Thyromegaly present.  Cardiovascular:     Rate and Rhythm: Normal rate and regular rhythm.     Heart sounds: Normal heart sounds. No murmur.    Pulmonary:     Effort: Pulmonary effort is normal. No respiratory distress.     Breath sounds: Normal breath sounds. No wheezing.  Musculoskeletal:     Cervical back: Neck supple.  Skin:    General: Skin is warm and dry.  Neurological:     Mental Status: She is alert and oriented to person, place, and time.  Psychiatric:        Behavior: Behavior normal.        Thought Content: Thought content normal.        Judgment: Judgment normal.           Assessment & Plan:  Anxiety-uncontrolled.  Will restart citalopram.  Hot flashes-we will see how this does with restart of citalopram.  Tobacco abuse- discussed importance of smoking cessation.   Pruritis- recommended trial of zyrtec. Recommended avoiding detergents and skin care products with dyes or fragrances.  Thyromegaly- new. Will obtain thyroid for further evaluation.  This visit occurred during the SARS-CoV-2 public health emergency.  Safety protocols were in place, including screening questions prior to the visit, additional usage of staff PPE, and extensive cleaning of exam room while observing appropriate contact time as indicated for disinfecting solutions.

## 2019-11-15 NOTE — Patient Instructions (Addendum)
Start citalopram 1/2 tab once daily for 1 week, then increase to a full tab once daily on week two. Add zyrtec once daily for itching.

## 2019-12-03 ENCOUNTER — Ambulatory Visit (HOSPITAL_BASED_OUTPATIENT_CLINIC_OR_DEPARTMENT_OTHER): Payer: Federal, State, Local not specified - PPO

## 2019-12-07 ENCOUNTER — Other Ambulatory Visit: Payer: Self-pay | Admitting: Family

## 2019-12-13 ENCOUNTER — Ambulatory Visit (HOSPITAL_BASED_OUTPATIENT_CLINIC_OR_DEPARTMENT_OTHER)
Admission: RE | Admit: 2019-12-13 | Discharge: 2019-12-13 | Disposition: A | Discharge status: Home / Self Care | Payer: Federal, State, Local not specified - PPO | Source: Ambulatory Visit | Attending: Family | Admitting: Family

## 2019-12-13 ENCOUNTER — Other Ambulatory Visit: Payer: Self-pay

## 2019-12-13 DIAGNOSIS — H1013 Acute atopic conjunctivitis, bilateral: Secondary | ICD-10-CM | POA: Diagnosis not present

## 2019-12-13 DIAGNOSIS — E042 Nontoxic multinodular goiter: Secondary | ICD-10-CM | POA: Diagnosis not present

## 2019-12-13 DIAGNOSIS — E010 Iodine-deficiency related diffuse (endemic) goiter: Secondary | ICD-10-CM | POA: Diagnosis not present

## 2019-12-20 ENCOUNTER — Ambulatory Visit (INDEPENDENT_AMBULATORY_CARE_PROVIDER_SITE_OTHER): Payer: Federal, State, Local not specified - PPO | Admitting: Family

## 2019-12-20 ENCOUNTER — Encounter: Payer: Self-pay | Admitting: Family

## 2019-12-20 ENCOUNTER — Other Ambulatory Visit: Payer: Self-pay

## 2019-12-20 VITALS — BP 112/79 | HR 71 | Temp 98.4°F | Resp 16 | Ht 64.0 in | Wt 128.6 lb

## 2019-12-20 DIAGNOSIS — E041 Nontoxic single thyroid nodule: Secondary | ICD-10-CM | POA: Diagnosis not present

## 2019-12-20 DIAGNOSIS — R232 Flushing: Secondary | ICD-10-CM

## 2019-12-20 DIAGNOSIS — L299 Pruritus, unspecified: Secondary | ICD-10-CM | POA: Diagnosis not present

## 2019-12-20 DIAGNOSIS — F411 Generalized anxiety disorder: Secondary | ICD-10-CM | POA: Diagnosis not present

## 2019-12-20 LAB — TSH: TSH: 2.31 u[IU]/mL (ref 0.35–4.50)

## 2019-12-20 LAB — T3, FREE: T3, Free: 2.8 pg/mL (ref 2.3–4.2)

## 2019-12-20 LAB — T4, FREE: Free T4: 0.69 ng/dL (ref 0.60–1.60)

## 2019-12-20 NOTE — Progress Notes (Signed)
Subjective:    Patient ID: Christie [name], female    DOB: 04-03-1966, 54 y.o.   MRN: [id number]  HPI  Patient is a 54 yr old female who presents today for follow up.   Pruritis- last visit we added zyrtec.  She reports that since she added Zyrtec her pruritus has improved significantly.  Hot flashes- last visit we restarted citalopram.  She continues to have hot flashes.  Anxiety- last visit we restarted citalopram.  She feels that since starting citalopram her anxiety symptoms have improved.  Thyromegaly- las visit we obtained a thyroid [place]. [place] noted the following:  IMPRESSION: 3.0 cm left inferior TR 3 nodule meets criteria for biopsy as above.  1.6 cm right inferior TR 3 nodule meets criteria follow-up in 1 Year.  She does report anxiety, diarrhea, and frequent sweating.  Review of Systems See HPI  Past Medical History:  Diagnosis Date  . Anxiety   . Sleep apnea      Social History   Socioeconomic History  . Marital status: Married    Spouse name: Not on file  . Number of children: Not on file  . Years of education: Not on file  . Highest education level: Not on file  Occupational History  . Not on file  Tobacco Use  . Smoking status: Current Every Day Smoker    Types: Cigarettes  . Smokeless tobacco: Never Used  . Tobacco comment: 10 cigarettes daily  Substance and Sexual Activity  . Alcohol use: Yes    Alcohol/week: 1.0 standard drink    Types: 1 Glasses of wine per week    Comment: daily  . Drug use: Yes    Types: Marijuana  . Sexual activity: Yes    Birth control/protection: None  Other Topics Concern  . Not on file  Social History Narrative  . Not on file   Social Determinants of Health   Financial Resource Strain:   . Difficulty of Paying Living Expenses:   Food Insecurity:   . Worried About Programme researcher, broadcasting/film/video in the Last Year:   . Barista in the Last Year:   Transportation Needs:   . Freight forwarder (Medical):    Marland Kitchen Lack of Transportation (Non-Medical):   Physical Activity:   . Days of Exercise per Week:   . Minutes of Exercise per Session:   Stress:   . Feeling of Stress :   Social Connections:   . Frequency of Communication with Friends and Family:   . Frequency of Social Gatherings with Friends and Family:   . Attends Religious Services:   . Active Member of Clubs or Organizations:   . Attends Banker Meetings:   Marland Kitchen Marital Status:   Intimate Partner Violence:   . Fear of Current or Ex-Partner:   . Emotionally Abused:   Marland Kitchen Physically Abused:   . Sexually Abused:     Past Surgical History:  Procedure Laterality Date  . ABLATION  2010   uterine  . HYSTEROSCOPY  2010    per patient    Family History  Problem Relation Age of Onset  . Hypertension Mother   . Heart failure Mother   . Diabetes Maternal Grandmother        type II  . Alcohol abuse Unknown        family hx  . Anxiety disorder Unknown        family hx  . Arthritis Unknown  family hx  . Hypertension Unknown        family hx  . Thyroid disease Unknown        family hx    No Known Allergies  Current Outpatient Medications on File Prior to Visit  Medication Sig Dispense Refill  . betamethasone dipropionate 0.05 % cream APPLY TOPICALLY 2 TIMES DAILY AS NEEDED 30 g 1  . CALCIUM-VITAMIN D PO Take by mouth daily.    . cetirizine (ZYRTEC) 10 MG tablet Take 1 tablet (10 mg total) by mouth daily. 30 tablet 11  . citalopram (CELEXA) 20 MG tablet TAKE 1/2 TABLET EVERY DAY FOR 7 DAYS THEN 1 TABLET EVERY DAY 90 tablet 1  . diclofenac sodium (VOLTAREN) 1 % GEL Apply 2 g topically 4 (four) times daily. To affected joint. 100 g 11  . fluticasone (FLONASE) 50 MCG/ACT nasal spray USE 2 SPRAYS IN TO EACH NOSTRIL DAILY AS DIRECTED 48 g 5   No current facility-administered medications on file prior to visit.    BP 112/79 (BP Location: Right Arm, Patient Position: Sitting, Cuff Size: Small)   Pulse 71   Temp  98.4 F (36.9 C) (Oral)   Resp 16   Ht 5\' 4"  (1.626 m)   Wt 128 lb 9.6 oz (58.3 kg)   SpO2 100%   BMI 22.07 kg/m       Objective:   Physical Exam Constitutional:      Appearance: She is well-developed.  Neck:     Thyroid: Thyromegaly present.  Cardiovascular:     Rate and Rhythm: Normal rate and regular rhythm.     Heart sounds: Normal heart sounds. No murmur heard.   Pulmonary:     Effort: Pulmonary effort is normal. No respiratory distress.     Breath sounds: Normal breath sounds. No wheezing.  Musculoskeletal:     Cervical back: Neck supple.  Skin:    General: Skin is warm and dry.  Neurological:     Mental Status: She is alert and oriented to person, place, and time.  Psychiatric:        Behavior: Behavior normal.        Thought Content: Thought content normal.        Judgment: Judgment normal.           Assessment & Plan:  Thyroid nodule-will obtain thyroid function testing today.  Given her symptoms, hyperthyroid is certainly a possibility.  I have also placed a referral to interventional radiology for biopsy of the larger left inferior thyroid nodule.  In addition a referral has been placed to endocrinology.  She will need a 1 year follow-up ultrasound for the right thyroid nodule.  Plan of care was discussed with patient and she is in agreement to proceed.  Hot flashes- unfortunately, she is not improving with citalopram. I am interested to see her TFT's to see if her symptoms may be related to hyperthyroid.   Anxiety-improving on citalopram- continue same.   Pruritis- improved with zyrtec, continue same.  This visit occurred during the SARS-CoV-2 public health emergency.  Safety protocols were in place, including screening questions prior to the visit, additional usage of staff PPE, and extensive cleaning of exam room while observing appropriate contact time as indicated for disinfecting solutions.

## 2019-12-20 NOTE — Patient Instructions (Signed)
Please complete lab work prior to leaving. You should be contacted about scheduling your appointment for thyroid biopsy and for referral to endocrinology.

## 2019-12-24 ENCOUNTER — Ambulatory Visit
Admission: RE | Admit: 2019-12-24 | Discharge: 2019-12-24 | Disposition: A | Discharge status: Home / Self Care | Payer: Federal, State, Local not specified - PPO | Source: Ambulatory Visit | Attending: Family | Admitting: Family

## 2019-12-24 ENCOUNTER — Other Ambulatory Visit (HOSPITAL_COMMUNITY)
Admission: RE | Admit: 2019-12-24 | Discharge: 2019-12-24 | Disposition: A | Discharge status: Home / Self Care | Payer: Federal, State, Local not specified - PPO | Source: Ambulatory Visit | Attending: Family | Admitting: Family

## 2019-12-24 DIAGNOSIS — E041 Nontoxic single thyroid nodule: Secondary | ICD-10-CM | POA: Insufficient documentation

## 2019-12-25 LAB — CYTOLOGY - NON PAP

## 2020-01-14 ENCOUNTER — Other Ambulatory Visit: Payer: Self-pay

## 2020-01-14 ENCOUNTER — Ambulatory Visit: Payer: Federal, State, Local not specified - PPO | Admitting: Internal Medicine

## 2020-01-14 ENCOUNTER — Encounter: Payer: Self-pay | Admitting: Internal Medicine

## 2020-01-14 VITALS — BP 128/78 | HR 68 | Ht 64.0 in | Wt 129.0 lb

## 2020-01-14 DIAGNOSIS — E042 Nontoxic multinodular goiter: Secondary | ICD-10-CM

## 2020-01-14 NOTE — Patient Instructions (Signed)
-   Please notify us should you notice any thyroid swelling or pain.  - We will see you in a year  - Your thyroid works normal

## 2020-01-14 NOTE — Progress Notes (Signed)
Name: Christie [name]  MRN/ DOB: [id number], 1965-06-29    Age/ Sex: 54 y.o., female    PCP: Sandford Craze, NP   Reason for Endocrinology Evaluation: MNG     Date of Initial Endocrinology Evaluation: 01/14/2020     HPI: [name] is a 54 y.o. female with a past medical history of GAD, allergic rhinitis and hotflashes. The patient presented for initial endocrinology clinic visit on 01/14/2020 for consultative assistance with her MNG   Pt was noted to have thyromegaly durin routine physical exam which prompted a thyroid ultrasound revealing MNG. She is S/P benign FNA of the left inferior nodule 12/2019.   No local neck symptoms per se  In the past few years she lost weight, but has been recently gaining weight (intentional) Has hot flashes worse at night   She had hysteroscopy   Has Gyn [facility] , last pap smear 2020 normal. Has hx of dysplasia  No FH of venothrombotic events No FH of breast and ovarian cancer    She was offered estrogen patches in the past but she is a smoker.    Mother with thyroid disease   HISTORY:  Past Medical History:  Past Medical History:  Diagnosis Date  . Anxiety   . Sleep apnea    Past Surgical History:  Past Surgical History:  Procedure Laterality Date  . ABLATION  2010   uterine  . HYSTEROSCOPY  2010    per patient      Social History:  reports that she has been smoking cigarettes. She has never used smokeless tobacco. She reports current alcohol use of about 1.0 standard drink of alcohol per week. She reports current drug use. Drug: Marijuana.  Family History: family history includes Alcohol abuse in her unknown relative; Anxiety disorder in her unknown relative; Arthritis in her unknown relative; Diabetes in her maternal grandmother; Heart failure in her mother; Hypertension in her mother and unknown relative; Thyroid disease in her unknown relative.   HOME MEDICATIONS: Allergies as of 01/14/2020     No Known Allergies     Medication List       Accurate as of January 14, 2020 11:34 AM. If you have any questions, ask your nurse or doctor.        betamethasone dipropionate 0.05 % cream APPLY TOPICALLY 2 TIMES DAILY AS NEEDED   CALCIUM-VITAMIN D PO Take by mouth daily.   cetirizine 10 MG tablet Commonly known as: ZYRTEC Take 1 tablet (10 mg total) by mouth daily.   citalopram 20 MG tablet Commonly known as: CELEXA TAKE 1/2 TABLET EVERY DAY FOR 7 DAYS THEN 1 TABLET EVERY DAY   diclofenac sodium 1 % Gel Commonly known as: VOLTAREN Apply 2 g topically 4 (four) times daily. To affected joint.   fluticasone 50 MCG/ACT nasal spray Commonly known as: FLONASE USE 2 SPRAYS IN TO EACH NOSTRIL DAILY AS DIRECTED         REVIEW OF SYSTEMS: A comprehensive ROS was conducted with the patient and is negative except as per HPI and below:  ROS     OBJECTIVE:  VS: BP 128/78 (BP Location: Right Arm, Patient Position: Sitting, Cuff Size: Normal)   Pulse 68   Ht 5\' 4"  (1.626 m)   Wt 129 lb (58.5 kg)   SpO2 97%   BMI 22.14 kg/m    Wt Readings from Last 3 Encounters:  01/14/20 129 lb (58.5 kg)  12/20/19 128 lb 9.6 oz (58.3 kg)  11/15/19 129 lb (58.5 kg)     EXAM: General: Pt appears well and is in NAD  Neck: General: Supple without adenopathy. Thyroid: Thyroid size normal. Fullness noted on the left.   Lungs: Clear with good BS bilat with no rales, rhonchi, or wheezes  Heart: Auscultation: RRR.  Abdomen: Normoactive bowel sounds, soft, nontender, without masses or organomegaly palpable  Extremities:  BL LE: No pretibial edema normal ROM and strength.  Skin: Hair: Texture and amount normal with gender appropriate distribution Skin Inspection: No rashes Skin Palpation: Skin temperature, texture, and thickness normal to palpation  Neuro: Cranial nerves: II - XII grossly intact  Motor: Normal strength throughout DTRs: 2+ and symmetric in UE without delay in relaxation  phase  Mental Status: Judgment, insight: Intact Orientation: Oriented to time, place, and person Mood and affect: No depression, anxiety, or agitation     DATA REVIEWED:  Results for JANETE, QUILLING (MRN [id number]) as of 01/14/2020 08:23  Ref. Range 12/20/2019 11:36  TSH Latest Ref Range: 0.35 - 4.50 uIU/mL 2.31  Triiodothyronine,Free,Serum Latest Ref Range: 2.3 - 4.2 pg/mL 2.8  T4,Free(Direct) Latest Ref Range: 0.60 - 1.60 ng/dL 2.63     Thyroid Ultrasound 12/13/2019  Estimated total number of nodules >/= 1 cm: 2  Number of spongiform nodules >/=  2 cm not described below (TR1): 0  Number of mixed cystic and solid nodules >/= 1.5 cm not described below (TR2): 0  _________________________________________________________  Nodule # 1:  Location: Right; Inferior  Maximum size: 1.6 cm; Other 2 dimensions: 0.8 x 1.1 cm  Composition: solid/almost completely solid (2)  Echogenicity: isoechoic (1)  Shape: not taller-than-wide (0)  Margins: ill-defined (0)  Echogenic foci: none (0)  ACR TI-RADS total points: 3.  ACR TI-RADS risk category: TR3 (3 points).  ACR TI-RADS recommendations:  *Given size (>/= 1.5 - 2.4 cm) and appearance, a follow-up ultrasound in 1 year should be considered based on TI-RADS criteria.  _________________________________________________________  Nodule # 2:  Location: Left; Inferior  Maximum size: 3.0 cm; Other 2 dimensions: 1.6 x 1.7 cm  Composition: solid/almost completely solid (2)  Echogenicity: isoechoic (1)  Shape: not taller-than-wide (0)  Margins: ill-defined (0)  Echogenic foci: none (0)  ACR TI-RADS total points: 3.  ACR TI-RADS risk category: TR3 (3 points).  ACR TI-RADS recommendations:  **Given size (>/= 2.5 cm) and appearance, fine needle aspiration of this mildly suspicious nodule should be considered based on  TI-RADS criteria.  _________________________________________________________  There are a few additional benign scattered subcentimeter cystic nodules noted. No hypervascularity. No regional adenopathy.  IMPRESSION: 3.0 cm left inferior TR 3 nodule meets criteria for biopsy as above.  1.6 cm right inferior TR 3 nodule meets criteria follow-up in 1 year.     Left inferior FNA 12/24/2019  FINAL MICROSCOPIC DIAGNOSIS:  - Consistent with benign follicular nodule (Bethesda category II)   ASSESSMENT/PLAN/RECOMMENDATIONS:   1. Multinodular Goiter:    - Pt is clinically and biochemically euthyroid  - No local neck symptoms  - S/P benign FNA of left inferior nodule, reassurance provided - Will repeat thyroid ultrasound annually  - Pt notify us with any new onset local neck symptoms    2. Hot flashes:   - This is NOT attributed to her thyroid as its normal.  - Recommend symptomatic treatment with HRT , or escalaing SSRI dose.  - She is a high risk for complications for HRT due to smoking status. Pt opted to hold off on this.  F/U in 1 yr   Signed electronically by: Lyndle Herrlich, MD  [facility]  [facility] [address] [place] [place], [place] [address] Phone: [phone] FAX: [phone]   CC: Sandford Craze, NP [address] [place] [address] Phone: [phone] Fax: [phone]   Return to Endocrinology clinic as below: Future Appointments  Date Time Provider Department Center  06/23/2020  3:40 PM Sandford Craze, NP [facility]  01/19/2021 11:10 AM Navraj Dreibelbis, Konrad Dolores, MD [facility]

## 2020-02-08 DIAGNOSIS — H04123 Dry eye syndrome of bilateral lacrimal glands: Secondary | ICD-10-CM | POA: Diagnosis not present

## 2020-02-11 ENCOUNTER — Telehealth: Payer: Self-pay | Admitting: Family

## 2020-02-11 NOTE — Telephone Encounter (Signed)
Patient is calling and requesting a refill for Valtrex sent to [facility] on [address], please advise. CB [phone]

## 2020-02-12 NOTE — Telephone Encounter (Signed)
Lvm for patient to call back, was a np in June, no rx for valtrex was entered at that time.

## 2020-02-13 DIAGNOSIS — Z113 Encounter for screening for infections with a predominantly sexual mode of transmission: Secondary | ICD-10-CM | POA: Diagnosis not present

## 2020-02-13 DIAGNOSIS — N898 Other specified noninflammatory disorders of vagina: Secondary | ICD-10-CM | POA: Diagnosis not present

## 2020-02-13 DIAGNOSIS — R309 Painful micturition, unspecified: Secondary | ICD-10-CM | POA: Diagnosis not present

## 2020-02-13 DIAGNOSIS — A6004 Herpesviral vulvovaginitis: Secondary | ICD-10-CM | POA: Diagnosis not present

## 2020-03-13 DIAGNOSIS — H1013 Acute atopic conjunctivitis, bilateral: Secondary | ICD-10-CM | POA: Diagnosis not present

## 2020-03-24 DIAGNOSIS — Z113 Encounter for screening for infections with a predominantly sexual mode of transmission: Secondary | ICD-10-CM | POA: Diagnosis not present

## 2020-04-03 ENCOUNTER — Ambulatory Visit (INDEPENDENT_AMBULATORY_CARE_PROVIDER_SITE_OTHER): Payer: Federal, State, Local not specified - PPO

## 2020-04-03 ENCOUNTER — Other Ambulatory Visit: Payer: Self-pay

## 2020-04-03 DIAGNOSIS — Z23 Encounter for immunization: Secondary | ICD-10-CM

## 2020-04-03 NOTE — Progress Notes (Signed)
Patient here today for flu shot  

## 2020-05-01 DIAGNOSIS — H04123 Dry eye syndrome of bilateral lacrimal glands: Secondary | ICD-10-CM | POA: Diagnosis not present

## 2020-05-01 DIAGNOSIS — H40033 Anatomical narrow angle, bilateral: Secondary | ICD-10-CM | POA: Diagnosis not present

## 2020-06-08 ENCOUNTER — Other Ambulatory Visit: Payer: Federal, State, Local not specified - PPO

## 2020-06-08 DIAGNOSIS — Z20822 Contact with and (suspected) exposure to covid-19: Secondary | ICD-10-CM | POA: Diagnosis not present

## 2020-06-09 LAB — NOVEL CORONAVIRUS, NAA: SARS-CoV-2, NAA: DETECTED — AB

## 2020-06-09 LAB — SARS-COV-2, NAA 2 DAY TAT

## 2020-06-15 ENCOUNTER — Other Ambulatory Visit: Payer: Self-pay

## 2020-06-15 ENCOUNTER — Encounter: Payer: Self-pay | Admitting: Internal Medicine

## 2020-06-15 DIAGNOSIS — Z20822 Contact with and (suspected) exposure to covid-19: Secondary | ICD-10-CM | POA: Diagnosis not present

## 2020-06-16 LAB — SARS-COV-2, NAA 2 DAY TAT

## 2020-06-16 LAB — NOVEL CORONAVIRUS, NAA: SARS-CoV-2, NAA: DETECTED — AB

## 2020-06-23 ENCOUNTER — Other Ambulatory Visit: Payer: Self-pay

## 2020-06-23 ENCOUNTER — Ambulatory Visit (INDEPENDENT_AMBULATORY_CARE_PROVIDER_SITE_OTHER): Payer: Federal, State, Local not specified - PPO | Admitting: Family

## 2020-06-23 ENCOUNTER — Encounter: Payer: Self-pay | Admitting: Family

## 2020-06-23 ENCOUNTER — Telehealth: Payer: Self-pay | Admitting: Family

## 2020-06-23 VITALS — BP 127/84 | HR 62 | Temp 98.6°F | Resp 16 | Ht 64.0 in | Wt 139.0 lb

## 2020-06-23 DIAGNOSIS — Z720 Tobacco use: Secondary | ICD-10-CM | POA: Diagnosis not present

## 2020-06-23 DIAGNOSIS — E041 Nontoxic single thyroid nodule: Secondary | ICD-10-CM | POA: Diagnosis not present

## 2020-06-23 DIAGNOSIS — F411 Generalized anxiety disorder: Secondary | ICD-10-CM | POA: Diagnosis not present

## 2020-06-23 DIAGNOSIS — R232 Flushing: Secondary | ICD-10-CM | POA: Diagnosis not present

## 2020-06-23 MED ORDER — CITALOPRAM HYDROBROMIDE 20 MG PO TABS: 30.0000 mg | ORAL_TABLET | Freq: Every day | ORAL | 0 refills | Status: DC

## 2020-06-23 NOTE — Progress Notes (Signed)
Subjective:    Patient ID: Christie [name], female    DOB: 06-03-1966, 55 y.o.   MRN: [id number]  HPI  Patient is a 55 yr old female who presents today for routine follow up  Thyroid nodule- FNA performed 12/24/19- Benign Follicular Nodule.  Lab Results  Component Value Date   TSH 2.31 12/20/2019   Hot flashes-  Notes some improvement on citalopram unless she drinks red wine wine.   Anxiety-  Reports that her anxiety is "about the same."  Scored 16 on GAD-7.  She denies depression.   Tobacco abuse- 1 PPD. She wants to quit.     Review of Systems See HPI    Past Medical History:  Diagnosis Date  . Anxiety   . Sleep apnea      Social History   Socioeconomic History  . Marital status: Married    Spouse name: Not on file  . Number of children: Not on file  . Years of education: Not on file  . Highest education level: Not on file  Occupational History  . Not on file  Tobacco Use  . Smoking status: Current Every Day Smoker    Types: Cigarettes  . Smokeless tobacco: Never Used  . Tobacco comment: 10 cigarettes daily  Substance and Sexual Activity  . Alcohol use: Yes    Alcohol/week: 1.0 standard drink    Types: 1 Glasses of wine per week    Comment: daily  . Drug use: Yes    Types: Marijuana  . Sexual activity: Yes    Birth control/protection: None  Other Topics Concern  . Not on file  Social History Narrative   ** Merged History Encounter **       Social Determinants of Health   Financial Resource Strain: Not on file  Food Insecurity: Not on file  Transportation Needs: Not on file  Physical Activity: Not on file  Stress: Not on file  Social Connections: Not on file  Intimate Partner Violence: Not on file    Past Surgical History:  Procedure Laterality Date  . ABLATION  2010   uterine  . HYSTEROSCOPY  2010    per patient    Family History  Problem Relation Age of Onset  . Hypertension Mother   . Heart failure Mother   . Diabetes  Maternal Grandmother        type II  . Alcohol abuse Unknown        family hx  . Anxiety disorder Unknown        family hx  . Arthritis Unknown        family hx  . Hypertension Unknown        family hx  . Thyroid disease Unknown        family hx    No Known Allergies  Current Outpatient Medications on File Prior to Visit  Medication Sig Dispense Refill  . betamethasone dipropionate 0.05 % cream APPLY TOPICALLY 2 TIMES DAILY AS NEEDED 30 g 1  . CALCIUM-VITAMIN D PO Take by mouth daily.    . cetirizine (ZYRTEC) 10 MG tablet Take 1 tablet (10 mg total) by mouth daily. 30 tablet 11  . citalopram (CELEXA) 20 MG tablet TAKE 1/2 TABLET EVERY DAY FOR 7 DAYS THEN 1 TABLET EVERY DAY 90 tablet 1  . diclofenac sodium (VOLTAREN) 1 % GEL Apply 2 g topically 4 (four) times daily. To affected joint. 100 g 11  . fluticasone (FLONASE) 50 MCG/ACT nasal spray USE 2  SPRAYS IN TO EACH NOSTRIL DAILY AS DIRECTED 48 g 5   No current facility-administered medications on file prior to visit.    BP 127/84 (BP Location: Right Arm, Patient Position: Sitting, Cuff Size: Small)   Pulse 62   Temp 98.6 F (37 C) (Oral)   Resp 16   Ht 5\' 4"  (1.626 m)   Wt 139 lb (63 kg)   SpO2 100%   BMI 23.86 kg/m    Objective:   Physical Exam Constitutional:      Appearance: She is well-developed and well-nourished.  Cardiovascular:     Rate and Rhythm: Normal rate and regular rhythm.     Heart sounds: Normal heart sounds. No murmur heard.   Pulmonary:     Effort: Pulmonary effort is normal. No respiratory distress.     Breath sounds: Normal breath sounds. No wheezing.  Psychiatric:        Mood and Affect: Mood and affect normal.        Behavior: Behavior normal.        Thought Content: Thought content normal.        Judgment: Judgment normal.           Assessment & Plan:  Thyroid nodule- negative biopsy.  Monitor.  Hot flashes- improved with addition of citalopram, continue same.  Anxiety- not  optimally controlled. Recommended that she increase citalopram from 20 mg to 30 mg.    Tobacco abuse- Chantix is still off the market.  I recommended that she use a nicotine replacement product. She tells me that the patch caused skin irritation.  Suggested gum/lozenges instead.  This visit occurred during the SARS-CoV-2 public health emergency.  Safety protocols were in place, including screening questions prior to the visit, additional usage of staff PPE, and extensive cleaning of exam room while observing appropriate contact time as indicated for disinfecting solutions.

## 2020-06-23 NOTE — Telephone Encounter (Signed)
Please request copy of pap from Jeani Sow NP

## 2020-06-23 NOTE — Telephone Encounter (Signed)
Record release has been sent to [facility] for pap.

## 2020-06-26 DIAGNOSIS — H10023 Other mucopurulent conjunctivitis, bilateral: Secondary | ICD-10-CM | POA: Diagnosis not present

## 2020-07-07 ENCOUNTER — Other Ambulatory Visit: Payer: Self-pay | Admitting: Family

## 2020-08-04 DIAGNOSIS — N951 Menopausal and female climacteric states: Secondary | ICD-10-CM | POA: Diagnosis not present

## 2020-08-04 DIAGNOSIS — Z130 Encounter for screening for diseases of the blood and blood-forming organs and certain disorders involving the immune mechanism: Secondary | ICD-10-CM | POA: Diagnosis not present

## 2020-08-04 DIAGNOSIS — Z01419 Encounter for gynecological examination (general) (routine) without abnormal findings: Secondary | ICD-10-CM | POA: Diagnosis not present

## 2020-08-04 DIAGNOSIS — Z1231 Encounter for screening mammogram for malignant neoplasm of breast: Secondary | ICD-10-CM | POA: Diagnosis not present

## 2020-08-04 DIAGNOSIS — Z780 Asymptomatic menopausal state: Secondary | ICD-10-CM | POA: Diagnosis not present

## 2020-08-04 DIAGNOSIS — Z113 Encounter for screening for infections with a predominantly sexual mode of transmission: Secondary | ICD-10-CM | POA: Diagnosis not present

## 2020-08-04 DIAGNOSIS — Z1151 Encounter for screening for human papillomavirus (HPV): Secondary | ICD-10-CM | POA: Diagnosis not present

## 2020-08-04 DIAGNOSIS — Z124 Encounter for screening for malignant neoplasm of cervix: Secondary | ICD-10-CM | POA: Diagnosis not present

## 2020-08-04 DIAGNOSIS — N898 Other specified noninflammatory disorders of vagina: Secondary | ICD-10-CM | POA: Diagnosis not present

## 2020-08-04 LAB — HM MAMMOGRAPHY

## 2020-08-05 DIAGNOSIS — Z124 Encounter for screening for malignant neoplasm of cervix: Secondary | ICD-10-CM | POA: Diagnosis not present

## 2020-08-05 DIAGNOSIS — Z1151 Encounter for screening for human papillomavirus (HPV): Secondary | ICD-10-CM | POA: Diagnosis not present

## 2020-08-11 ENCOUNTER — Other Ambulatory Visit: Payer: Self-pay

## 2020-08-11 ENCOUNTER — Ambulatory Visit: Payer: Federal, State, Local not specified - PPO | Admitting: Family

## 2020-08-11 ENCOUNTER — Ambulatory Visit (HOSPITAL_BASED_OUTPATIENT_CLINIC_OR_DEPARTMENT_OTHER)
Admission: RE | Admit: 2020-08-11 | Discharge: 2020-08-11 | Disposition: A | Discharge status: Home / Self Care | Payer: Federal, State, Local not specified - PPO | Source: Ambulatory Visit | Attending: Family | Admitting: Family

## 2020-08-11 ENCOUNTER — Ambulatory Visit (INDEPENDENT_AMBULATORY_CARE_PROVIDER_SITE_OTHER): Payer: Federal, State, Local not specified - PPO | Admitting: Family

## 2020-08-11 ENCOUNTER — Encounter: Payer: Self-pay | Admitting: Family

## 2020-08-11 VITALS — BP 114/71 | HR 66 | Temp 98.6°F | Resp 16 | Ht 64.0 in | Wt 135.0 lb

## 2020-08-11 DIAGNOSIS — Z0000 Encounter for general adult medical examination without abnormal findings: Secondary | ICD-10-CM

## 2020-08-11 DIAGNOSIS — Z0289 Encounter for other administrative examinations: Secondary | ICD-10-CM

## 2020-08-11 DIAGNOSIS — Z23 Encounter for immunization: Secondary | ICD-10-CM | POA: Diagnosis not present

## 2020-08-11 DIAGNOSIS — M542 Cervicalgia: Secondary | ICD-10-CM | POA: Insufficient documentation

## 2020-08-11 DIAGNOSIS — M25512 Pain in left shoulder: Secondary | ICD-10-CM | POA: Diagnosis not present

## 2020-08-11 DIAGNOSIS — Z833 Family history of diabetes mellitus: Secondary | ICD-10-CM | POA: Diagnosis not present

## 2020-08-11 DIAGNOSIS — M25511 Pain in right shoulder: Secondary | ICD-10-CM | POA: Diagnosis not present

## 2020-08-11 LAB — HM PAP SMEAR: HM Pap smear: NEGATIVE

## 2020-08-11 MED ORDER — GABAPENTIN 300 MG PO CAPS
300.0000 mg | ORAL_CAPSULE | Freq: Every day | ORAL | 1 refills | Status: DC
Start: 2020-08-11 — End: 2020-11-20

## 2020-08-11 NOTE — Progress Notes (Signed)
Subjective:    Patient ID: Christie [name], female    DOB: 03-30-1966, 55 y.o.   MRN: [id number]  HPI  Patient presents today for complete physical.  Immunizations: flu shot up to date, due for Tdap, declines shingrix Diet: healthy Exercise: enjoys walking, weights, stretching Colonoscopy: 2017 Pap Smear: 03/30/17 Mammogram: 07/02/2019 Vision:  Up to date Dental:  Up to date  Neck pain- reports that she fell 3 weeks ago in her bathroom (tripped in the dark). Reports that she fell onto her back.  Had some stiffness.  Taking ibuprofen.   Wt Readings from Last 3 Encounters:  08/11/20 135 lb (61.2 kg)  06/23/20 139 lb (63 kg)  01/14/20 129 lb (58.5 kg)     Review of Systems  Constitutional: Negative for unexpected weight change.  HENT: Negative for hearing loss and rhinorrhea.   Eyes: Negative for visual disturbance.  Respiratory: Negative for cough and shortness of breath.   Cardiovascular: Negative for chest pain.  Gastrointestinal: Negative for constipation and diarrhea.  Genitourinary: Negative for dysuria, frequency and hematuria.  Musculoskeletal: Positive for neck pain. Negative for arthralgias.  Skin: Negative for rash.   Past Medical History:  Diagnosis Date  . Anxiety      Social History   Socioeconomic History  . Marital status: Married    Spouse name: Not on file  . Number of children: Not on file  . Years of education: Not on file  . Highest education level: Not on file  Occupational History  . Not on file  Tobacco Use  . Smoking status: Current Every Day Smoker    Types: Cigarettes  . Smokeless tobacco: Never Used  . Tobacco comment: 10 cigarettes daily  Substance and Sexual Activity  . Alcohol use: Yes    Alcohol/week: 1.0 standard drink    Types: 1 Glasses of wine per week    Comment: daily 1-2 glass wine/day  . Drug use: Yes    Frequency: 7.0 times per week    Types: Marijuana  . Sexual activity: Yes    Birth control/protection:  None  Other Topics Concern  . Not on file  Social History Narrative   ** Merged History Encounter **       Social Determinants of Health   Financial Resource Strain: Not on file  Food Insecurity: Not on file  Transportation Needs: Not on file  Physical Activity: Not on file  Stress: Not on file  Social Connections: Not on file  Intimate Partner Violence: Not on file    Past Surgical History:  Procedure Laterality Date  . ABLATION  2010   uterine  . HYSTEROSCOPY  2010    per patient    Family History  Problem Relation Age of Onset  . Hypertension Mother   . Heart failure Mother   . Lung cancer Mother        s/p resection  . Diabetes Maternal Grandmother        type II  . Hypertension Maternal Grandmother   . Alcohol abuse Other        family hx  . Anxiety disorder Other        family hx  . Arthritis Other        family hx  . Hypertension Other        family hx  . Thyroid disease Other        family hx  . Gallstones Brother   . Hypertension Brother     No Known  Allergies  Current Outpatient Medications on File Prior to Visit  Medication Sig Dispense Refill  . betamethasone dipropionate 0.05 % cream APPLY TOPICALLY 2 TIMES DAILY AS NEEDED 30 g 1  . CALCIUM-VITAMIN D PO Take by mouth daily.    . cetirizine (ZYRTEC) 10 MG tablet Take 1 tablet (10 mg total) by mouth daily. 30 tablet 11  . citalopram (CELEXA) 20 MG tablet Take 1.5 tablets (30 mg total) by mouth daily. 135 tablet 1  . diclofenac sodium (VOLTAREN) 1 % GEL Apply 2 g topically 4 (four) times daily. To affected joint. 100 g 11  . fluticasone (FLONASE) 50 MCG/ACT nasal spray USE 2 SPRAYS IN TO EACH NOSTRIL DAILY AS DIRECTED 48 g 5   No current facility-administered medications on file prior to visit.    BP 114/71 (BP Location: Right Arm, Patient Position: Sitting, Cuff Size: Small)   Pulse 66   Temp 98.6 F (37 C) (Oral)   Resp 16   Ht 5\' 4"  (1.626 m)   Wt 135 lb (61.2 kg)   SpO2 100%   BMI  23.17 kg/m       Objective:   Physical Exam Constitutional:      Appearance: She is well-developed and well-nourished.  HENT:     Right Ear: Tympanic membrane and ear canal normal.     Left Ear: Tympanic membrane and ear canal normal.  Neck:     Thyroid: Thyromegaly present.  Cardiovascular:     Rate and Rhythm: Normal rate and regular rhythm.     Heart sounds: Normal heart sounds. No murmur heard.   Pulmonary:     Effort: Pulmonary effort is normal. No respiratory distress.     Breath sounds: Normal breath sounds. No wheezing.  Neurological:     Deep Tendon Reflexes:     Reflex Scores:      Patellar reflexes are 2+ on the right side and 2+ on the left side. Psychiatric:        Mood and Affect: Mood and affect normal.        Behavior: Behavior normal.        Thought Content: Thought content normal.        Judgment: Judgment normal.           Assessment & Plan:  Preventative care- discussed healthy diet, exercise and weight loss. Tdap today. Declines shingrix.  Pap/mammo up to date- will request copies from her GYN.  Neck pain- new. will obtain x-ray of c-spine for further evaluation.  Recommended short term use of ibuprofen or aleve for pain.   This visit occurred during the SARS-CoV-2 public health emergency.  Safety protocols were in place, including screening questions prior to the visit, additional usage of staff PPE, and extensive cleaning of exam room while observing appropriate contact time as indicated for disinfecting solutions.

## 2020-08-11 NOTE — Patient Instructions (Signed)
Please restart gabapentin 300mg  at bedtime. Work on quitting smoking.

## 2020-08-12 LAB — COMPREHENSIVE METABOLIC PANEL
ALT: 14 U/L (ref 0–35)
AST: 21 U/L (ref 0–37)
Albumin: 4.5 g/dL (ref 3.5–5.2)
Alkaline Phosphatase: 64 U/L (ref 39–117)
BUN: 22 mg/dL (ref 6–23)
CO2: 29 mEq/L (ref 19–32)
Calcium: 10.2 mg/dL (ref 8.4–10.5)
Chloride: 104 mEq/L (ref 96–112)
Creatinine, Ser: 1.26 mg/dL — ABNORMAL HIGH (ref 0.40–1.20)
GFR: 48.17 mL/min — ABNORMAL LOW (ref >60.00)
Glucose, Bld: 80 mg/dL (ref 70–99)
Potassium: 4.1 mEq/L (ref 3.5–5.1)
Sodium: 139 mEq/L (ref 135–145)
Total Bilirubin: 0.4 mg/dL (ref 0.2–1.2)
Total Protein: 7 g/dL (ref 6.0–8.3)

## 2020-08-12 LAB — LIPID PANEL
Cholesterol: 210 mg/dL — ABNORMAL HIGH (ref 0–200)
HDL: 86.6 mg/dL (ref >39.00)
LDL Cholesterol: 95 mg/dL (ref 0–99)
NonHDL: 123.07
Total CHOL/HDL Ratio: 2
Triglycerides: 138 mg/dL (ref 0.0–149.0)
VLDL: 27.6 mg/dL (ref 0.0–40.0)

## 2020-08-15 DIAGNOSIS — H04123 Dry eye syndrome of bilateral lacrimal glands: Secondary | ICD-10-CM | POA: Diagnosis not present

## 2020-09-07 ENCOUNTER — Ambulatory Visit: Payer: Federal, State, Local not specified - PPO | Admitting: Family

## 2020-09-17 ENCOUNTER — Observation Stay (HOSPITAL_COMMUNITY)
Admission: EM | Admit: 2020-09-17 | Discharge: 2020-09-19 | Disposition: A | Discharge status: Home / Self Care | Payer: Federal, State, Local not specified - PPO | Attending: Family Medicine | Admitting: Family Medicine

## 2020-09-17 DIAGNOSIS — R221 Localized swelling, mass and lump, neck: Secondary | ICD-10-CM | POA: Diagnosis not present

## 2020-09-17 DIAGNOSIS — H6012 Cellulitis of left external ear: Secondary | ICD-10-CM | POA: Diagnosis not present

## 2020-09-17 DIAGNOSIS — F17210 Nicotine dependence, cigarettes, uncomplicated: Secondary | ICD-10-CM | POA: Insufficient documentation

## 2020-09-17 DIAGNOSIS — Z720 Tobacco use: Secondary | ICD-10-CM | POA: Diagnosis present

## 2020-09-17 DIAGNOSIS — I889 Nonspecific lymphadenitis, unspecified: Secondary | ICD-10-CM | POA: Diagnosis not present

## 2020-09-17 DIAGNOSIS — F411 Generalized anxiety disorder: Secondary | ICD-10-CM | POA: Diagnosis present

## 2020-09-17 DIAGNOSIS — Z20822 Contact with and (suspected) exposure to covid-19: Secondary | ICD-10-CM | POA: Diagnosis not present

## 2020-09-17 DIAGNOSIS — J309 Allergic rhinitis, unspecified: Secondary | ICD-10-CM | POA: Diagnosis present

## 2020-09-17 LAB — CBC WITH DIFFERENTIAL/PLATELET
Abs Immature Granulocytes: 0.02 10*3/uL (ref 0.00–0.07)
Basophils Absolute: 0 10*3/uL (ref 0.0–0.1)
Basophils Relative: 0 %
Eosinophils Absolute: 0 10*3/uL (ref 0.0–0.5)
Eosinophils Relative: 0 %
HCT: 38.2 % (ref 36.0–46.0)
Hemoglobin: 12.8 g/dL (ref 12.0–15.0)
Immature Granulocytes: 0 %
Lymphocytes Relative: 20 %
Lymphs Abs: 2 10*3/uL (ref 0.7–4.0)
MCH: 31.4 pg (ref 26.0–34.0)
MCHC: 33.5 g/dL (ref 30.0–36.0)
MCV: 93.6 fL (ref 80.0–100.0)
Monocytes Absolute: 1.1 10*3/uL — ABNORMAL HIGH (ref 0.1–1.0)
Monocytes Relative: 11 %
Neutro Abs: 6.8 10*3/uL (ref 1.7–7.7)
Neutrophils Relative %: 69 %
Platelets: 159 10*3/uL (ref 150–400)
RBC: 4.08 MIL/uL (ref 3.87–5.11)
RDW: 14.4 % (ref 11.5–15.5)
WBC: 10 10*3/uL (ref 4.0–10.5)
nRBC: 0 % (ref 0.0–0.2)

## 2020-09-17 MED ORDER — VANCOMYCIN HCL 1000 MG/200ML IV SOLN
1000.0000 mg | Freq: Once | INTRAVENOUS | Status: AC
  Administered 2020-09-18: 1000 mg via INTRAVENOUS
  Filled 2020-09-17: qty 200

## 2020-09-17 MED ORDER — SODIUM CHLORIDE 0.9 % IV SOLN
1.0000 g | Freq: Once | INTRAVENOUS | Status: AC
  Administered 2020-09-17: 1 g via INTRAVENOUS
  Filled 2020-09-17: qty 10

## 2020-09-17 NOTE — ED Provider Notes (Signed)
[facility] EMERGENCY DEPARTMENT Provider Note   CSN: [id number] Arrival date & time: 09/17/20  2114     History Chief Complaint  Patient presents with  . Animal Bite    Christie [name] is a 55 y.o. female.  Patient is a 55 year old previously healthy female who presents with left-sided neck swelling and redness that extends up to her left ear.  She reports that this started 2 days ago, mostly in her neck, with a swollen area, that she thought was from a spider bite.  Never saw a spider.  States that the pain, redness, swelling have continued to extend up into her ear.  Pain became very severe today, especially in her ear.  She has not had any drainage from the area.  She reports feeling chilled, has not had any fevers.  Has otherwise been in her normal state of health.  Denies any chest pain, difficulty breathing.  She has not taken anything for this.  She has never had something like this before.  She did state that a couple of weeks ago, she had swelling on the right side of her neck, but it did not become red or this severe.  She related to allergies.  She denies any congestion or mouth sores.  Denies changes to her voice.        Past Medical History:  Diagnosis Date  . Anxiety     Patient Active Problem List   Diagnosis Date Noted  . Multinodular goiter 01/14/2020  . Eczema 07/11/2015  . Tobacco abuse 09/19/2014  . Preventative health care 06/01/2014  . Hot flashes 05/21/2013  . Epicondylitis elbow, medial 05/21/2013  . Alopecia 02/25/2013  . Back pain 04/03/2012  . Anxiety state 06/25/2010  . Allergic rhinitis 06/25/2010    Past Surgical History:  Procedure Laterality Date  . ABLATION  2010   uterine  . HYSTEROSCOPY  2010    per patient     OB History   No obstetric history on file.     Family History  Problem Relation Age of Onset  . Hypertension Mother   . Heart failure Mother   . Lung cancer Mother        s/p resection  .  Diabetes Maternal Grandmother        type II  . Hypertension Maternal Grandmother   . Alcohol abuse Other        family hx  . Anxiety disorder Other        family hx  . Arthritis Other        family hx  . Hypertension Other        family hx  . Thyroid disease Other        family hx  . Gallstones Brother   . Hypertension Brother     Social History   Tobacco Use  . Smoking status: Current Every Day Smoker    Types: Cigarettes  . Smokeless tobacco: Never Used  . Tobacco comment: 10 cigarettes daily  Substance Use Topics  . Alcohol use: Yes    Alcohol/week: 1.0 standard drink    Types: 1 Glasses of wine per week    Comment: daily 1-2 glass wine/day  . Drug use: Yes    Frequency: 7.0 times per week    Types: Marijuana    Home Medications Prior to Admission medications   Medication Sig Start Date End Date Taking? Authorizing Provider  betamethasone dipropionate 0.05 % cream APPLY TOPICALLY 2 TIMES DAILY AS  NEEDED 11/15/19   Sandford Craze, NP  CALCIUM-VITAMIN D PO Take by mouth daily.    [provider]  cetirizine (ZYRTEC) 10 MG tablet Take 1 tablet (10 mg total) by mouth daily. 11/15/19   Sandford Craze, NP  citalopram (CELEXA) 20 MG tablet Take 1.5 tablets (30 mg total) by mouth daily. 07/07/20   Sandford Craze, NP  diclofenac sodium (VOLTAREN) 1 % GEL Apply 2 g topically 4 (four) times daily. To affected joint. 03/22/19   Rodolph Bong, MD  fluticasone (FLONASE) 50 MCG/ACT nasal spray USE 2 SPRAYS IN TO EACH NOSTRIL DAILY AS DIRECTED 05/20/16   Sandford Craze, NP  gabapentin (NEURONTIN) 300 MG capsule Take 1 capsule (300 mg total) by mouth at bedtime. 08/11/20   Sandford Craze, NP    Allergies    Patient has no known allergies.  Review of Systems   Review of Systems  Constitutional: Positive for chills. Negative for fever.  HENT: Positive for ear pain. Negative for congestion, sore throat, trouble swallowing and voice change.   Eyes:  Negative for visual disturbance.  Respiratory: Negative for cough and shortness of breath.   Cardiovascular: Negative for chest pain and leg swelling.  Gastrointestinal: Negative for abdominal pain, diarrhea, nausea and vomiting.  Genitourinary: Negative for difficulty urinating.  Musculoskeletal: Negative for neck pain and neck stiffness.  Skin: Positive for color change.  Neurological: Negative for dizziness, facial asymmetry and headaches.  Hematological: Positive for adenopathy.    Physical Exam Updated Vital Signs BP 136/75   Pulse 70   Temp 98.8 F (37.1 C) (Oral)   Resp 13   Ht 5\' 4"  (1.626 m)   Wt 62.1 kg   SpO2 96%   BMI 23.52 kg/m   Physical Exam Vitals reviewed.  Constitutional:      General: She is not in acute distress.    Appearance: Normal appearance. She is not ill-appearing or toxic-appearing.  HENT:     Head:      Comments: No occipital LN palpated    Nose: No congestion.     Mouth/Throat:     Mouth: Mucous membranes are moist.     Pharynx: Oropharynx is clear.     Comments: No obvious abscess or decaying teeth Eyes:     Extraocular Movements: Extraocular movements intact.     Conjunctiva/sclera: Conjunctivae normal.  Cardiovascular:     Rate and Rhythm: Normal rate and regular rhythm.     Heart sounds: No murmur heard. No friction rub. No gallop.   Pulmonary:     Effort: Pulmonary effort is normal.     Breath sounds: Normal breath sounds. No wheezing, rhonchi or rales.  Musculoskeletal:     Cervical back: Normal range of motion.  Lymphadenopathy:     Cervical: Cervical adenopathy (tender left anterior cervical lymph node with overlying erythema) present.  Skin:    General: Skin is warm and dry.     Capillary Refill: Capillary refill takes less than 2 seconds.     Findings: Erythema (noted on left ear cartilage and extending into left side of neck) present.  Neurological:     General: No focal deficit present.     Mental Status: She is alert  and oriented to person, place, and time.  Psychiatric:        Mood and Affect: Mood normal.        Behavior: Behavior normal.         ED Results / Procedures / Treatments   Labs (all  labs ordered are listed, but only abnormal results are displayed) Labs Reviewed  CBC WITH DIFFERENTIAL/PLATELET - Abnormal; Notable for the following components:      Result Value   Monocytes Absolute 1.1 (*)    All other components within normal limits  RESP PANEL BY RT-PCR (FLU A&B, COVID) [id number]  BASIC METABOLIC PANEL    EKG None  Radiology No results found.  Procedures Procedures   Medications Ordered in ED Medications  vancomycin (VANCOREADY) IVPB 1000 mg/200 mL (has no administration in time range)  cefTRIAXone (ROCEPHIN) 1 g in sodium chloride 0.9 % 100 mL IVPB (0 g Intravenous Stopped 09/17/20 2359)    ED Course  I have reviewed the triage vital signs and the nursing notes.  Pertinent labs & imaging results that were available during my care of the patient were reviewed by me and considered in my medical decision making (see chart for details).    MDM Rules/Calculators/A&P                          Patient is a 55 year old previously healthy female who presents with 2 days of swelling and redness to the left ear and left neck.  She does not have any signs of systemic infection at present, does endorse some chills.  Vital signs are stable.  On exam, she is very tender to palpation in region of anterior cervical lymph node on the left, TTP of cartilage on left ear.  No signs of inner ear infection, TM appears patent.  Only mild TTP of left mastoid, but drastically decreased from palpation of LN, therefore low suspicion for mastoiditis.  She is able to speak without muffled voice, has patent airway, and full ROM of her neck, therefore low suspicion for deeper infection and would hold off on further imaging.  Will give patient CTX and Vancomycin and obtain BMP and CBC.  Patient will  require admission for observation on IV antibiotics.  BMP pending.  CBC with WBC WNL.  Will page [facility] for admission.  [facility] agrees to admit, will order CT soft tissue neck.   Final Clinical Impression(s) / ED Diagnoses Final diagnoses:  Lymphadenitis    Rx / DC Orders ED Discharge Orders    None       Unknown Jim, DO 09/18/20 0009    Sabas Sous, MD 09/18/20 [id number]

## 2020-09-17 NOTE — ED Triage Notes (Signed)
Pt reports being bite by an animal, Pt think it was an spider. Pt believe she has gotten bite on Tuesday ands states behind left ear it has been swollen, red, itching and pain at site

## 2020-09-18 ENCOUNTER — Observation Stay (HOSPITAL_COMMUNITY): Payer: Federal, State, Local not specified - PPO

## 2020-09-18 DIAGNOSIS — J029 Acute pharyngitis, unspecified: Secondary | ICD-10-CM | POA: Diagnosis not present

## 2020-09-18 DIAGNOSIS — H6012 Cellulitis of left external ear: Secondary | ICD-10-CM | POA: Diagnosis not present

## 2020-09-18 DIAGNOSIS — L089 Local infection of the skin and subcutaneous tissue, unspecified: Secondary | ICD-10-CM | POA: Diagnosis not present

## 2020-09-18 DIAGNOSIS — L989 Disorder of the skin and subcutaneous tissue, unspecified: Secondary | ICD-10-CM | POA: Diagnosis not present

## 2020-09-18 LAB — COMPREHENSIVE METABOLIC PANEL
ALT: 18 U/L (ref 0–44)
AST: 20 U/L (ref 15–41)
Albumin: 3.7 g/dL (ref 3.5–5.0)
Alkaline Phosphatase: 50 U/L (ref 38–126)
Anion gap: 8 (ref 5–15)
BUN: 13 mg/dL (ref 6–20)
CO2: 24 mmol/L (ref 22–32)
Calcium: 9.5 mg/dL (ref 8.9–10.3)
Chloride: 105 mmol/L (ref 98–111)
Creatinine, Ser: 1.12 mg/dL — ABNORMAL HIGH (ref 0.44–1.00)
GFR, Estimated: 58 mL/min — ABNORMAL LOW (ref >60)
Glucose, Bld: 114 mg/dL — ABNORMAL HIGH (ref 70–99)
Potassium: 3.6 mmol/L (ref 3.5–5.1)
Sodium: 137 mmol/L (ref 135–145)
Total Bilirubin: 0.6 mg/dL (ref 0.3–1.2)
Total Protein: 6.3 g/dL — ABNORMAL LOW (ref 6.5–8.1)

## 2020-09-18 LAB — BASIC METABOLIC PANEL
Anion gap: 6 (ref 5–15)
BUN: 13 mg/dL (ref 6–20)
CO2: 27 mmol/L (ref 22–32)
Calcium: 10.1 mg/dL (ref 8.9–10.3)
Chloride: 105 mmol/L (ref 98–111)
Creatinine, Ser: 1.16 mg/dL — ABNORMAL HIGH (ref 0.44–1.00)
GFR, Estimated: 56 mL/min — ABNORMAL LOW (ref >60)
Glucose, Bld: 108 mg/dL — ABNORMAL HIGH (ref 70–99)
Potassium: 3.7 mmol/L (ref 3.5–5.1)
Sodium: 138 mmol/L (ref 135–145)

## 2020-09-18 LAB — CBC
HCT: 37 % (ref 36.0–46.0)
Hemoglobin: 12.3 g/dL (ref 12.0–15.0)
MCH: 30.8 pg (ref 26.0–34.0)
MCHC: 33.2 g/dL (ref 30.0–36.0)
MCV: 92.5 fL (ref 80.0–100.0)
Platelets: 158 10*3/uL (ref 150–400)
RBC: 4 MIL/uL (ref 3.87–5.11)
RDW: 14.4 % (ref 11.5–15.5)
WBC: 8.2 10*3/uL (ref 4.0–10.5)
nRBC: 0 % (ref 0.0–0.2)

## 2020-09-18 LAB — HIV ANTIBODY (ROUTINE TESTING W REFLEX): HIV Screen 4th Generation wRfx: NONREACTIVE

## 2020-09-18 LAB — RESP PANEL BY RT-PCR (FLU A&B, COVID) ARPGX2
Influenza A by PCR: NEGATIVE
Influenza B by PCR: NEGATIVE
SARS Coronavirus 2 by RT PCR: NEGATIVE

## 2020-09-18 MED ORDER — SODIUM CHLORIDE 0.45 % IV SOLN: INTRAVENOUS | Status: DC

## 2020-09-18 MED ORDER — NICOTINE 21 MG/24HR TD PT24
21.0000 mg | MEDICATED_PATCH | Freq: Every day | TRANSDERMAL | Status: DC
  Administered 2020-09-18 – 2020-09-19 (×2): 21 mg via TRANSDERMAL
  Filled 2020-09-18 (×2): qty 1

## 2020-09-18 MED ORDER — ONDANSETRON HCL 4 MG PO TABS: 4.0000 mg | ORAL_TABLET | Freq: Four times a day (QID) | ORAL | Status: DC | PRN

## 2020-09-18 MED ORDER — VANCOMYCIN HCL 1000 MG/200ML IV SOLN
1000.0000 mg | INTRAVENOUS | Status: DC
  Administered 2020-09-18: 1000 mg via INTRAVENOUS
  Filled 2020-09-18 (×2): qty 200

## 2020-09-18 MED ORDER — ENOXAPARIN SODIUM 40 MG/0.4ML ~~LOC~~ SOLN
40.0000 mg | SUBCUTANEOUS | Status: DC
  Administered 2020-09-18 – 2020-09-19 (×2): 40 mg via SUBCUTANEOUS
  Filled 2020-09-18 (×2): qty 0.4

## 2020-09-18 MED ORDER — SODIUM CHLORIDE 0.9 % IV SOLN
2.0000 g | Freq: Two times a day (BID) | INTRAVENOUS | Status: DC
  Administered 2020-09-18 – 2020-09-19 (×3): 2 g via INTRAVENOUS
  Filled 2020-09-18 (×4): qty 2

## 2020-09-18 MED ORDER — ACETAMINOPHEN 325 MG PO TABS
650.0000 mg | ORAL_TABLET | Freq: Four times a day (QID) | ORAL | Status: DC | PRN
  Administered 2020-09-18 – 2020-09-19 (×2): 650 mg via ORAL
  Filled 2020-09-18 (×2): qty 2

## 2020-09-18 MED ORDER — ACETAMINOPHEN 650 MG RE SUPP
650.0000 mg | Freq: Four times a day (QID) | RECTAL | Status: DC | PRN
Start: 2020-09-18 — End: 2020-09-19

## 2020-09-18 MED ORDER — MORPHINE SULFATE (PF) 2 MG/ML IV SOLN
2.0000 mg | INTRAVENOUS | Status: DC | PRN
  Administered 2020-09-18 – 2020-09-19 (×2): 2 mg via INTRAVENOUS
  Filled 2020-09-18 (×2): qty 1

## 2020-09-18 MED ORDER — ONDANSETRON HCL 4 MG/2ML IJ SOLN: 4.0000 mg | Freq: Four times a day (QID) | INTRAMUSCULAR | Status: DC | PRN

## 2020-09-18 MED ORDER — IOHEXOL 300 MG/ML  SOLN
75.0000 mL | Freq: Once | INTRAMUSCULAR | Status: AC | PRN
  Administered 2020-09-18: 75 mL via INTRAVENOUS

## 2020-09-18 NOTE — H&P (Signed)
History and Physical   Christie [name] MWN:[id number] DOB: August 22, 1965 DOA: 09/17/2020  Referring MD/NP/PA: Dr. Pilar Plate  PCP: Sandford Craze, NP   Outpatient Specialists: None  Patient coming from: Home  Chief Complaint: Swelling of the left ear and neck  HPI: Christie [name] is a 55 y.o. female with medical history significant of anxiety disorder, tobacco abuse, who presents with left earlobe swelling redness and pain as well as left neck area swelling pain.  This is progressive over the last few days.  The last 2 days the swelling has worsened.  Patient reported wearing a different brand of air rink lately.  She did not have any injury but suspect it could also be an insect bite.  Was seen in the ER with extensive swelling of the left ear redness and evidence of lymphangitis, going down her left neck area.  She has significant history of allergic rhinitis and eczema.  This does not seem to be related.  She has some mild fever but no chills.  Patient being admitted with suspicion of cellulitis probably triggered by scratch of the left earlobe..  ED Course: Vitals and lab work all appear to be significantly within normal except for creatinine 1.16 and glucose 108.  COVID-19 and influenza screen negative.  CT neck currently pending.  Patient being admitted with acute infection and cellulitis.  Due to proximity to the head and the neck we will admit for IV antibiotics.  Review of Systems: As per HPI otherwise 10 point review of systems negative.    Past Medical History:  Diagnosis Date  . Anxiety     Past Surgical History:  Procedure Laterality Date  . ABLATION  2010   uterine  . HYSTEROSCOPY  2010    per patient     reports that she has been smoking cigarettes. She has never used smokeless tobacco. She reports current alcohol use of about 1.0 standard drink of alcohol per week. She reports current drug use. Frequency: 7.00 times per week. Drug: Marijuana.  No Known  Allergies  Family History  Problem Relation Age of Onset  . Hypertension Mother   . Heart failure Mother   . Lung cancer Mother        s/p resection  . Diabetes Maternal Grandmother        type II  . Hypertension Maternal Grandmother   . Alcohol abuse Other        family hx  . Anxiety disorder Other        family hx  . Arthritis Other        family hx  . Hypertension Other        family hx  . Thyroid disease Other        family hx  . Gallstones Brother   . Hypertension Brother      Prior to Admission medications   Medication Sig Start Date End Date Taking? Authorizing Provider  betamethasone dipropionate 0.05 % cream APPLY TOPICALLY 2 TIMES DAILY AS NEEDED 11/15/19   Sandford Craze, NP  CALCIUM-VITAMIN D PO Take by mouth daily.    [provider]  cetirizine (ZYRTEC) 10 MG tablet Take 1 tablet (10 mg total) by mouth daily. 11/15/19   Sandford Craze, NP  citalopram (CELEXA) 20 MG tablet Take 1.5 tablets (30 mg total) by mouth daily. 07/07/20   Sandford Craze, NP  diclofenac sodium (VOLTAREN) 1 % GEL Apply 2 g topically 4 (four) times daily. To affected joint. 03/22/19   Clementeen Graham  S, MD  EYSUVIS 0.25 % SUSP  05/08/20   [provider]  fluticasone (FLONASE) 50 MCG/ACT nasal spray USE 2 SPRAYS IN TO EACH NOSTRIL DAILY AS DIRECTED 05/20/16   Sandford Craze'Sullivan, Melissa, NP  gabapentin (NEURONTIN) 300 MG capsule Take 1 capsule (300 mg total) by mouth at bedtime. 08/11/20   Sandford Craze'Sullivan, Melissa, NP    Physical Exam: Vitals:   09/17/20 2126 09/17/20 2227 09/17/20 2230 09/17/20 2315  BP: 117/90 125/82 114/83 136/75  Pulse: 83 73 73 70  Resp: 16 18  13   Temp: 98.8 F (37.1 C)     TempSrc: Oral     SpO2: 98% 99% 100% 96%  Weight: 62.1 kg     Height: 5\' 4"  (1.626 m)         Constitutional: NAD, calm, comfortable Vitals:   09/17/20 2126 09/17/20 2227 09/17/20 2230 09/17/20 2315  BP: 117/90 125/82 114/83 136/75  Pulse: 83 73 73 70  Resp: 16 18  13    Temp: 98.8 F (37.1 C)     TempSrc: Oral     SpO2: 98% 99% 100% 96%  Weight: 62.1 kg     Height: 5\' 4"  (1.626 m)      Eyes: PERRL, lids and conjunctivae normal ENMT: Left earlobe swollen, red, tender, no discharge, there is tracking down the left upper neck also right.  Mucous membranes are moist. Posterior pharynx clear of any exudate or lesions.Normal dentition.  Neck: Suspected lymphangitis in the upper part of the left side of the neck normal, supple, no masses, no thyromegaly Respiratory: clear to auscultation bilaterally, no wheezing, no crackles. Normal respiratory effort. No accessory muscle use.  Cardiovascular: Regular rate and rhythm, no murmurs / rubs / gallops. No extremity edema. 2+ pedal pulses. No carotid bruits.  Abdomen: no tenderness, no masses palpated. No hepatosplenomegaly. Bowel sounds positive.  Musculoskeletal: no clubbing / cyanosis. No joint deformity upper and lower extremities. Good ROM, no contractures. Normal muscle tone.  Skin: no rashes, lesions, ulcers. No induration Neurologic: CN 2-12 grossly intact. Sensation intact, DTR normal. Strength 5/5 in all 4.  Psychiatric: Normal judgment and insight. Alert and oriented x 3. Normal mood.     Labs on Admission: I have personally reviewed following labs and imaging studies  CBC: Recent Labs  Lab 09/17/20 2258  WBC 10.0  NEUTROABS 6.8  HGB 12.8  HCT 38.2  MCV 93.6  PLT 159   Basic Metabolic Panel: Recent Labs  Lab 09/17/20 2258  NA 138  K 3.7  CL 105  CO2 27  GLUCOSE 108*  BUN 13  CREATININE 1.16*  CALCIUM 10.1   GFR: Estimated Creatinine Clearance: 47.3 mL/min (A) (by C-G formula based on SCr of 1.16 mg/dL (H)). Liver Function Tests: No results for input(s): AST, ALT, ALKPHOS, BILITOT, PROT, ALBUMIN in the last 168 hours. No results for input(s): LIPASE, AMYLASE in the last 168 hours. No results for input(s): AMMONIA in the last 168 hours. Coagulation Profile: No results for input(s):  INR, PROTIME in the last 168 hours. Cardiac Enzymes: No results for input(s): CKTOTAL, CKMB, CKMBINDEX, TROPONINI in the last 168 hours. BNP (last 3 results) No results for input(s): PROBNP in the last 8760 hours. HbA1C: No results for input(s): HGBA1C in the last 72 hours. CBG: No results for input(s): GLUCAP in the last 168 hours. Lipid Profile: No results for input(s): CHOL, HDL, LDLCALC, TRIG, CHOLHDL, LDLDIRECT in the last 72 hours. Thyroid Function Tests: No results for input(s): TSH, T4TOTAL, FREET4, T3FREE, THYROIDAB  in the last 72 hours. Anemia Panel: No results for input(s): VITAMINB12, FOLATE, FERRITIN, TIBC, IRON, RETICCTPCT in the last 72 hours. Urine analysis:    Component Value Date/Time   COLORURINE YELLOW 05/26/2014 1850   APPEARANCEUR CLEAR 05/26/2014 1850   LABSPEC <=1.005 (A) 05/26/2014 1850   PHURINE 5.5 05/26/2014 1850   GLUCOSEU NEGATIVE 05/26/2014 1850   HGBUR NEGATIVE 05/26/2014 1850   BILIRUBINUR NEGATIVE 01/09/2018 1018   KETONESUR NEGATIVE 05/26/2014 1850   PROTEINUR Negative 01/09/2018 1018   UROBILINOGEN 0.2 01/09/2018 1018   UROBILINOGEN 0.2 05/26/2014 1850   NITRITE NEGATIVE 01/09/2018 1018   NITRITE NEGATIVE 05/26/2014 1850   LEUKOCYTESUR Trace (A) 01/09/2018 1018   Sepsis Labs: @LABRCNTIP (procalcitonin:4,lacticidven:4) ) Recent Results (from the past 240 hour(s))  Resp Panel by RT-PCR (Flu A&B, Covid) Nasopharyngeal Swab     Status: None   Collection Time: 09/17/20 10:58 PM   Specimen: Nasopharyngeal Swab; Nasopharyngeal(NP) swabs in vial transport medium  Result Value Ref Range Status   SARS Coronavirus 2 by RT PCR NEGATIVE NEGATIVE Final    Comment: (NOTE) SARS-CoV-2 target nucleic acids are NOT DETECTED.  The SARS-CoV-2 RNA is generally detectable in upper respiratory specimens during the acute phase of infection. The lowest concentration of SARS-CoV-2 viral copies this assay can detect is 138 copies/mL. A negative result does not  preclude SARS-Cov-2 infection and should not be used as the sole basis for treatment or other patient management decisions. A negative result may occur with  improper specimen collection/handling, submission of specimen other than nasopharyngeal swab, presence of viral mutation(s) within the areas targeted by this assay, and inadequate number of viral copies(<138 copies/mL). A negative result must be combined with clinical observations, patient history, and epidemiological information. The expected result is Negative.  Fact Sheet for Patients:  11/17/20  Fact Sheet for Healthcare Providers:  BloggerCourse.com  This test is no t yet approved or cleared by the [employer] FDA and  has been authorized for detection and/or diagnosis of SARS-CoV-2 by FDA under an Emergency Use Authorization (EUA). This EUA will remain  in effect (meaning this test can be used) for the duration of the COVID-19 declaration under Section 564(b)(1) of the Act, 21 U.S.C.section 360bbb-3(b)(1), unless the authorization is terminated  or revoked sooner.       Influenza A by PCR NEGATIVE NEGATIVE Final   Influenza B by PCR NEGATIVE NEGATIVE Final    Comment: (NOTE) The Xpert Xpress SARS-CoV-2/FLU/RSV plus assay is intended as an aid in the diagnosis of influenza from Nasopharyngeal swab specimens and should not be used as a sole basis for treatment. Nasal washings and aspirates are unacceptable for Xpert Xpress SARS-CoV-2/FLU/RSV testing.  Fact Sheet for Patients: [place]  Fact Sheet for Healthcare Providers: BloggerCourse.com  This test is not yet approved or cleared by the [employer] FDA and has been authorized for detection and/or diagnosis of SARS-CoV-2 by FDA under an Emergency Use Authorization (EUA). This EUA will remain in effect (meaning this test can be used) for the  duration of the COVID-19 declaration under Section 564(b)(1) of the Act, 21 U.S.C. section 360bbb-3(b)(1), unless the authorization is terminated or revoked.  Performed at [facility], [address] [place], [place]      Radiological Exams on Admission: No results found.    Assessment/Plan Principal Problem:   Cellulitis of auricle of left ear Active Problems:   Anxiety state   Allergic rhinitis   Tobacco abuse     #1 cellulitis  of the left earlobe and neck: Patient will be admitted for IV antibiotics.  Due to the proximity of the area to her neck and the brain we will be aggressive.  Agree with CT of the neck to rule out an abscess but appears to be superficial cellulitis and possible lymphangitis.  Vancomycin and cefepime ordered.  Blood cultures obtained.  Monitor.  If there is any improvement transition to oral antibiotics and discharged home.  #2 anxiety disorder: Continue home regimen.  #3 tobacco abuse: Counseling provided.  Offered nicotine patch.   DVT prophylaxis: Lovenox Code Status: Full code Family Communication: No family at bedside Disposition Plan: Home Consults called: None Admission status: Observation  Severity of Illness: The appropriate patient status for this patient is OBSERVATION. Observation status is judged to be reasonable and necessary in order to provide the required intensity of service to ensure the patient's safety. The patient's presenting symptoms, physical exam findings, and initial radiographic and laboratory data in the context of their medical condition is felt to place them at decreased risk for further clinical deterioration. Furthermore, it is anticipated that the patient will be medically stable for discharge from the hospital within 2 midnights of admission. The following factors support the patient status of observation.   " The patient's presenting symptoms include swelling in the left earlobe and neck. " The  physical exam findings include redness and swelling of the left earlobe. " The initial radiographic and laboratory data are normal and blood work.     Lonia Blood MD [facility] Pager [phone]  If 7PM-7AM, please contact night-coverage [email or web address] Password [email or web address]  09/18/2020, 12:20 AM

## 2020-09-18 NOTE — Care Plan (Signed)
This 55 years old female with PMH significant for anxiety, tobacco abuse presents in the ED with left earlobe swelling, redness and pain,  left neck  swelling and pain.  Patient reports swelling has been getting worse.  Patient denies any injury but suspect that it could be a insect bite.  Vitals and labs unremarkable except serum creatinine 1.16.  CT neck shows soft tissue swelling consistent with cellulitis.  Patient was admitted and started on IV antibiotics(cefepime, vancomycin and ceftriaxone.)  Left ear swelling is improving.  Affected area appears erythematous, warm swollen and tender.  Patient was seen and examined at bedside.

## 2020-09-18 NOTE — Progress Notes (Signed)
Pharmacy Antibiotic Note  Christie [name] is a 55 y.o. female admitted on 09/17/2020 with cellulitis of the neck/ear.  Pharmacy has been consulted for Vancomycin/Cefepime dosing. WBC WNL. Renal function ok.   Plan: Vancomycin 1000 mg IV q24h >>Estimated AUC: 513 Cefepime 2g IV q12h Trend WBC, temp, renal function  F/U infectious work-up Drug levels as indicated   Height: 5\' 4"  (162.6 cm) Weight: 62.1 kg (137 lb) IBW/kg (Calculated) : 54.7  Temp (24hrs), Avg:98.8 F (37.1 C), Min:98.8 F (37.1 C), Max:98.8 F (37.1 C)  Recent Labs  Lab 09/17/20 2258  WBC 10.0  CREATININE 1.16*    Estimated Creatinine Clearance: 47.3 mL/min (A) (by C-G formula based on SCr of 1.16 mg/dL (H)).    No Known Allergies  11/17/20, PharmD, BCPS Clinical Pharmacist Phone: [phone]

## 2020-09-19 DIAGNOSIS — H6012 Cellulitis of left external ear: Principal | ICD-10-CM

## 2020-09-19 MED ORDER — SULFAMETHOXAZOLE-TRIMETHOPRIM 800-160 MG PO TABS: 1.0000 | ORAL_TABLET | Freq: Two times a day (BID) | ORAL | 0 refills | Status: DC

## 2020-09-19 NOTE — Discharge Instructions (Signed)
Advised to follow up PCP in one week. Advised to take bactrim DS twice daily for 8 days to completed 10 days course.

## 2020-09-19 NOTE — Discharge Summary (Addendum)
Physician Discharge Summary  Christie [name] ZOX:[id number] DOB: Jul 26, 1965 DOA: 09/17/2020  PCP: Sandford Craze, NP  Admit date: 09/17/2020   Discharge date: 09/19/2020  Admitted From: Home.  Disposition:  HOME.  Recommendations for Outpatient Follow-up:  1. Follow up with PCP in 1-2 weeks. 2. Please obtain BMP/CBC in one week. 3. Advised to take Bactrim DS twice daily for 8 days to complete 10 days course.  Home Health: None. Equipment/Devices: None  Discharge Condition: Stable CODE STATUS:Full code Diet recommendation: Heart Healthy  Brief [facility] course: This 55 years old female with PMH significant for anxiety, tobacco abuse presents in the ED with left earlobe swelling, redness and pain, associated with left neck  swelling and pain.  Patient reports swelling has been getting worse.  Patient denies any injury but suspect that it could be a insect bite.  Vitals and labs were unremarkable except serum creatinine 1.16.  CT neck shows soft tissue swelling consistent with cellulitis.   Patient was admitted for observation and started on IV antibiotics (Cefepime, Vancomycin and Ceftriaxone.)  Left ear swelling is improving.  Affected area appears erythematous, warm, swollen and tender. Patient has received 3-4 doses of IV antibiotics , left ear swelling and redness has significantly improved.  Patient feels better and wants to be discharged.  Patient is being discharged on Bactrim DS twice daily for 8 days to complete 10-day course.  She was managed for below problems.   Discharge Diagnoses:  Principal Problem:   Cellulitis of auricle of left ear Active Problems:   Anxiety state   Allergic rhinitis   Tobacco abuse  Cellulitis of the left earlobe and neck:  Due to the proximity of the area to her neck and the brain we will be aggressive.   Agree with CT of the neck to rule out an abscess but appears to be superficial cellulitis and possible lymphangitis.  Vancomycin and cefepime ordered.  Blood cultures obtained.  Patient had significant improvement in the left ear swelling. Patient feels better and patient is being discharged on Bactrim DS twice daily for 8 days.  Anxiety disorder: Continue home regimen.  Tobacco abuse: Counseling provided.  Offered nicotine patch  Discharge Instructions  Discharge Instructions    Call MD for:  persistant dizziness or light-headedness   Complete by: As directed    Call MD for:  redness, tenderness, or signs of infection (pain, swelling, redness, odor or green/yellow discharge around incision site)   Complete by: As directed    Call MD for:  temperature >100.4   Complete by: As directed    Diet - low sodium heart healthy   Complete by: As directed    Diet Carb Modified   Complete by: As directed    Discharge instructions   Complete by: As directed    Advised to follow up PCP in one week. Advised to take bactrim DS twice daily for 8 days to completed 10 days course.   Increase activity slowly   Complete by: As directed      Allergies as of 09/19/2020   No Known Allergies     Medication List    TAKE these medications   betamethasone dipropionate 0.05 % cream APPLY TOPICALLY 2 TIMES DAILY AS NEEDED   CALCIUM-VITAMIN D PO Take 1 capsule by mouth daily.   cetirizine 10 MG tablet Commonly known as: ZYRTEC Take 1 tablet (10 mg total) by mouth daily.   citalopram 20 MG tablet Commonly known as: CELEXA Take 1.5 tablets (30  mg total) by mouth daily.   diclofenac sodium 1 % Gel Commonly known as: VOLTAREN Apply 2 g topically 4 (four) times daily. To affected joint.   Eysuvis 0.25 % Susp Generic drug: Loteprednol Etabonate Place 1 drop into both eyes daily.   fluticasone 50 MCG/ACT nasal spray Commonly known as: FLONASE USE 2 SPRAYS IN TO EACH NOSTRIL DAILY AS DIRECTED What changed:   how much to take  how to take this  when to take this  reasons to take this  additional  instructions   gabapentin 300 MG capsule Commonly known as: NEURONTIN Take 1 capsule (300 mg total) by mouth at bedtime.   sulfamethoxazole-trimethoprim 800-160 MG tablet Commonly known as: BACTRIM DS Take 1 tablet by mouth 2 (two) times daily.       Follow-up Information    Sandford Craze, NP Follow up in 1 week(s).   Specialty: Internal Medicine Contact information: [address] [place] [address] [phone]              No Known Allergies  Consultations:  None   Procedures/Studies: CT Soft Tissue Neck W Contrast  Result Date: 09/18/2020 CLINICAL DATA:  Sore throat EXAM: CT NECK WITH CONTRAST TECHNIQUE: Multidetector CT imaging of the neck was performed using the standard protocol following the bolus administration of intravenous contrast. CONTRAST:  18mL OMNIPAQUE IOHEXOL 300 MG/ML  SOLN COMPARISON:  None. FINDINGS: PHARYNX AND LARYNX: The nasopharynx, oropharynx and larynx are normal. Visible portions of the oral cavity, tongue base and floor of mouth are normal. Normal epiglottis, vallecula and pyriform sinuses. The larynx is normal. No retropharyngeal abscess, effusion or lymphadenopathy. SALIVARY GLANDS: Normal parotid, submandibular and sublingual glands. THYROID: Unremarkable LYMPH NODES: No enlarged or abnormal density lymph nodes. Inferior left parotid lymph node measures 9 mm. VASCULAR: Major cervical vessels are patent. LIMITED INTRACRANIAL: Normal. VISUALIZED ORBITS: Normal. MASTOIDS AND VISUALIZED PARANASAL SINUSES: No fluid levels or advanced mucosal thickening. No mastoid effusion. SKELETON: No bony spinal canal stenosis. No lytic or blastic lesions. UPPER CHEST: Clear. OTHER: There is subcutaneous inflammatory change of the left pinna and retroauricular soft tissues. No abscess or fluid collection. IMPRESSION: 1. Subcutaneous inflammatory change of the left pinna and retroauricular soft tissues, consistent with cellulitis. No abscess or  fluid collection. 2. Likely reactive inferior left parotid lymph node. Electronically Signed   By: Deatra Robinson M.D.   On: 09/18/2020 01:42      Subjective: Patient was seen and examined at bedside.  Overnight events noted.  Left ear swelling has significantly improved,  Patient denies any pain,  Redness,  swelling has improved.  Patient feels better and wants to be discharged.  Discharge Exam: Vitals:   09/19/20 0330 09/19/20 0755  BP: 123/77 109/75  Pulse: (!) 57 60  Resp: 20 16  Temp: 97.6 F (36.4 C) 98.2 F (36.8 C)  SpO2: 99% 100%   Vitals:   09/18/20 2000 09/18/20 2317 09/19/20 0330 09/19/20 0755  BP: (!) 141/78 129/78 123/77 109/75  Pulse: 60 (!) 54 (!) 57 60  Resp: 20 20 20 16   Temp: 98.6 F (37 C) 98.3 F (36.8 C) 97.6 F (36.4 C) 98.2 F (36.8 C)  TempSrc: Oral Oral Oral Oral  SpO2: 100% 96% 99% 100%  Weight:      Height:        General: Pt is alert, awake, not in acute distress Cardiovascular: RRR, S1/S2 +, no rubs, no gallops Respiratory: CTA bilaterally, no wheezing, no rhonchi  Abdominal: Soft, NT, ND, bowel sounds + Extremities: no edema, no cyanosis    The results of significant diagnostics from this hospitalization (including imaging, microbiology, ancillary and laboratory) are listed below for reference.     Microbiology: Recent Results (from the past 240 hour(s))  Resp Panel by RT-PCR (Flu A&B, Covid) Nasopharyngeal Swab     Status: None   Collection Time: 09/17/20 10:58 PM   Specimen: Nasopharyngeal Swab; Nasopharyngeal(NP) swabs in vial transport medium  Result Value Ref Range Status   SARS Coronavirus 2 by RT PCR NEGATIVE NEGATIVE Final    Comment: (NOTE) SARS-CoV-2 target nucleic acids are NOT DETECTED.  The SARS-CoV-2 RNA is generally detectable in upper respiratory specimens during the acute phase of infection. The lowest concentration of SARS-CoV-2 viral copies this assay can detect is 138 copies/mL. A negative result does not  preclude SARS-Cov-2 infection and should not be used as the sole basis for treatment or other patient management decisions. A negative result may occur with  improper specimen collection/handling, submission of specimen other than nasopharyngeal swab, presence of viral mutation(s) within the areas targeted by this assay, and inadequate number of viral copies(<138 copies/mL). A negative result must be combined with clinical observations, patient history, and epidemiological information. The expected result is Negative.  Fact Sheet for Patients:  BloggerCourse.com  Fact Sheet for Healthcare Providers:  SeriousBroker.it  This test is no t yet approved or cleared by the [place] FDA and  has been authorized for detection and/or diagnosis of SARS-CoV-2 by FDA under an Emergency Use Authorization (EUA). This EUA will remain  in effect (meaning this test can be used) for the duration of the COVID-19 declaration under Section 564(b)(1) of the Act, 21 U.S.C.section 360bbb-3(b)(1), unless the authorization is terminated  or revoked sooner.       Influenza A by PCR NEGATIVE NEGATIVE Final   Influenza B by PCR NEGATIVE NEGATIVE Final    Comment: (NOTE) The Xpert Xpress SARS-CoV-2/FLU/RSV plus assay is intended as an aid in the diagnosis of influenza from Nasopharyngeal swab specimens and should not be used as a sole basis for treatment. Nasal washings and aspirates are unacceptable for Xpert Xpress SARS-CoV-2/FLU/RSV testing.  Fact Sheet for Patients: BloggerCourse.com  Fact Sheet for Healthcare Providers: SeriousBroker.it  This test is not yet approved or cleared by the [place] FDA and has been authorized for detection and/or diagnosis of SARS-CoV-2 by FDA under an Emergency Use Authorization (EUA). This EUA will remain in effect (meaning this test can be used) for the  duration of the COVID-19 declaration under Section 564(b)(1) of the Act, 21 U.S.C. section 360bbb-3(b)(1), unless the authorization is terminated or revoked.  Performed at [facility], [address], [place], [place] [address]      Labs: BNP (last 3 results) No results for input(s): BNP in the last 8760 hours. Basic Metabolic Panel: Recent Labs  Lab 09/17/20 2258 09/18/20 0250  NA 138 137  K 3.7 3.6  CL 105 105  CO2 27 24  GLUCOSE 108* 114*  BUN 13 13  CREATININE 1.16* 1.12*  CALCIUM 10.1 9.5   Liver Function Tests: Recent Labs  Lab 09/18/20 0250  AST 20  ALT 18  ALKPHOS 50  BILITOT 0.6  PROT 6.3*  ALBUMIN 3.7   No results for input(s): LIPASE, AMYLASE in the last 168 hours. No results for input(s): AMMONIA in the last 168 hours. CBC: Recent Labs  Lab 09/17/20 2258 09/18/20 0250  WBC 10.0 8.2  NEUTROABS 6.8  --   HGB 12.8 12.3  HCT 38.2 37.0  MCV 93.6 92.5  PLT 159 158   Cardiac Enzymes: No results for input(s): CKTOTAL, CKMB, CKMBINDEX, TROPONINI in the last 168 hours. BNP: Invalid input(s): POCBNP CBG: No results for input(s): GLUCAP in the last 168 hours. D-Dimer No results for input(s): DDIMER in the last 72 hours. Hgb A1c No results for input(s): HGBA1C in the last 72 hours. Lipid Profile No results for input(s): CHOL, HDL, LDLCALC, TRIG, CHOLHDL, LDLDIRECT in the last 72 hours. Thyroid function studies No results for input(s): TSH, T4TOTAL, T3FREE, THYROIDAB in the last 72 hours.  Invalid input(s): FREET3 Anemia work up No results for input(s): VITAMINB12, FOLATE, FERRITIN, TIBC, IRON, RETICCTPCT in the last 72 hours. Urinalysis    Component Value Date/Time   COLORURINE YELLOW 05/26/2014 1850   APPEARANCEUR CLEAR 05/26/2014 1850   LABSPEC <=1.005 (A) 05/26/2014 1850   PHURINE 5.5 05/26/2014 1850   GLUCOSEU NEGATIVE 05/26/2014 1850   HGBUR NEGATIVE 05/26/2014 1850   BILIRUBINUR NEGATIVE 01/09/2018 1018   KETONESUR NEGATIVE  05/26/2014 1850   PROTEINUR Negative 01/09/2018 1018   UROBILINOGEN 0.2 01/09/2018 1018   UROBILINOGEN 0.2 05/26/2014 1850   NITRITE NEGATIVE 01/09/2018 1018   NITRITE NEGATIVE 05/26/2014 1850   LEUKOCYTESUR Trace (A) 01/09/2018 1018   Sepsis Labs Invalid input(s): PROCALCITONIN,  WBC,  LACTICIDVEN Microbiology Recent Results (from the past 240 hour(s))  Resp Panel by RT-PCR (Flu A&B, Covid) Nasopharyngeal Swab     Status: None   Collection Time: 09/17/20 10:58 PM   Specimen: Nasopharyngeal Swab; Nasopharyngeal(NP) swabs in vial transport medium  Result Value Ref Range Status   SARS Coronavirus 2 by RT PCR NEGATIVE NEGATIVE Final    Comment: (NOTE) SARS-CoV-2 target nucleic acids are NOT DETECTED.  The SARS-CoV-2 RNA is generally detectable in upper respiratory specimens during the acute phase of infection. The lowest concentration of SARS-CoV-2 viral copies this assay can detect is 138 copies/mL. A negative result does not preclude SARS-Cov-2 infection and should not be used as the sole basis for treatment or other patient management decisions. A negative result may occur with  improper specimen collection/handling, submission of specimen other than nasopharyngeal swab, presence of viral mutation(s) within the areas targeted by this assay, and inadequate number of viral copies(<138 copies/mL). A negative result must be combined with clinical observations, patient history, and epidemiological information. The expected result is Negative.  Fact Sheet for Patients:  BloggerCourse.comhttps://www.fda.gov/media/152166/download  Fact Sheet for Healthcare Providers:  SeriousBroker.ithttps://www.fda.gov/media/152162/download  This test is no t yet approved or cleared by the [place] FDA and  has been authorized for detection and/or diagnosis of SARS-CoV-2 by FDA under an Emergency Use Authorization (EUA). This EUA will remain  in effect (meaning this test can be used) for the duration of the COVID-19  declaration under Section 564(b)(1) of the Act, 21 U.S.C.section 360bbb-3(b)(1), unless the authorization is terminated  or revoked sooner.       Influenza A by PCR NEGATIVE NEGATIVE Final   Influenza B by PCR NEGATIVE NEGATIVE Final    Comment: (NOTE) The Xpert Xpress SARS-CoV-2/FLU/RSV plus assay is intended as an aid in the diagnosis of influenza from Nasopharyngeal swab specimens and should not be used as a sole basis for treatment. Nasal washings and aspirates are unacceptable for Xpert Xpress SARS-CoV-2/FLU/RSV testing.  Fact Sheet for Patients: BloggerCourse.comhttps://www.fda.gov/media/152166/download  Fact Sheet for Healthcare Providers: SeriousBroker.ithttps://www.fda.gov/media/152162/download  This test is not yet approved or cleared by the [place] FDA  and has been authorized for detection and/or diagnosis of SARS-CoV-2 by FDA under an Emergency Use Authorization (EUA). This EUA will remain in effect (meaning this test can be used) for the duration of the COVID-19 declaration under Section 564(b)(1) of the Act, 21 U.S.C. section 360bbb-3(b)(1), unless the authorization is terminated or revoked.  Performed at [facility], [address] [place], [place] [address]      Time coordinating discharge: Over 30 minutes  SIGNED:   Cipriano Bunker, MD  [facility] 09/19/2020, 11:42 AM Pager   If 7PM-7AM, please contact night-coverage [email or web address]

## 2020-09-22 DIAGNOSIS — H43393 Other vitreous opacities, bilateral: Secondary | ICD-10-CM | POA: Diagnosis not present

## 2020-11-10 ENCOUNTER — Other Ambulatory Visit: Payer: Self-pay | Admitting: Family

## 2020-11-13 ENCOUNTER — Ambulatory Visit: Payer: Federal, State, Local not specified - PPO | Admitting: Family

## 2020-11-13 NOTE — Progress Notes (Incomplete)
Subjective:   By signing my name below, I, Shehryar Baig, attest that this documentation has been prepared under the direction and in the presence of [name]. 11/13/2020      Patient ID: Christie [name], female    DOB: [birth year], 55 y.o.   MRN: [id number]  No chief complaint on file.   HPI Patient is in today for a office visit.   Health Maintenance Due  Topic Date Due  . Pneumococcal Vaccine 74-25 Years old (1 of 2 - PPSV23) Never done  . Hepatitis C Screening  Never done  . Zoster Vaccines- Shingrix (1 of 2) Never done    Past Medical History:  Diagnosis Date  . Anxiety     Past Surgical History:  Procedure Laterality Date  . ABLATION  2010   uterine  . HYSTEROSCOPY  2010    per patient    Family History  Problem Relation Age of Onset  . Hypertension Mother   . Heart failure Mother   . Lung cancer Mother        s/p resection  . Diabetes Maternal Grandmother        type II  . Hypertension Maternal Grandmother   . Alcohol abuse Other        family hx  . Anxiety disorder Other        family hx  . Arthritis Other        family hx  . Hypertension Other        family hx  . Thyroid disease Other        family hx  . Gallstones Brother   . Hypertension Brother     Social History   Socioeconomic History  . Marital status: Married    Spouse name: Not on file  . Number of children: Not on file  . Years of education: Not on file  . Highest education level: Not on file  Occupational History  . Not on file  Tobacco Use  . Smoking status: Current Every Day Smoker    Types: Cigarettes  . Smokeless tobacco: Never Used  . Tobacco comment: 10 cigarettes daily  Substance and Sexual Activity  . Alcohol use: Yes    Alcohol/week: 1.0 standard drink    Types: 1 Glasses of wine per week    Comment: daily 1-2 glass wine/day  . Drug use: Yes    Frequency: 7.0 times per week    Types: Marijuana  . Sexual activity: Yes    Birth  control/protection: None  Other Topics Concern  . Not on file  Social History Narrative   ** Merged History Encounter **       Social Determinants of Health   Financial Resource Strain: Not on file  Food Insecurity: Not on file  Transportation Needs: Not on file  Physical Activity: Not on file  Stress: Not on file  Social Connections: Not on file  Intimate Partner Violence: Not on file    Outpatient Medications Prior to Visit  Medication Sig Dispense Refill  . betamethasone dipropionate 0.05 % cream APPLY TOPICALLY 2 TIMES DAILY AS NEEDED 30 g 1  . CALCIUM-VITAMIN D PO Take 1 capsule by mouth daily.    . cetirizine (ZYRTEC) 10 MG tablet Take 1 tablet (10 mg total) by mouth daily. 30 tablet 11  . citalopram (CELEXA) 20 MG tablet Take 1.5 tablets (30 mg total) by mouth daily. 135 tablet 1  . diclofenac sodium (VOLTAREN) 1 % GEL Apply 2 g  topically 4 (four) times daily. To affected joint. 100 g 11  . EYSUVIS 0.25 % SUSP Place 1 drop into both eyes daily.    . fluticasone (FLONASE) 50 MCG/ACT nasal spray USE 2 SPRAYS IN TO EACH NOSTRIL DAILY AS DIRECTED (Patient taking differently: Place 2 sprays into both nostrils daily as needed for allergies.) 48 g 5  . gabapentin (NEURONTIN) 300 MG capsule Take 1 capsule (300 mg total) by mouth at bedtime. 90 capsule 1  . sulfamethoxazole-trimethoprim (BACTRIM DS) 800-160 MG tablet Take 1 tablet by mouth 2 (two) times daily. 16 tablet 0   No facility-administered medications prior to visit.    No Known Allergies  ROS     Objective:    Physical Exam Constitutional:      General: She is not in acute distress.    Appearance: Normal appearance. She is not ill-appearing.  HENT:     Head: Normocephalic and atraumatic.     Right Ear: External ear normal.     Left Ear: External ear normal.  Eyes:     Extraocular Movements: Extraocular movements intact.     Pupils: Pupils are equal, round, and reactive to light.  Cardiovascular:     Rate  and Rhythm: Normal rate and regular rhythm.     Pulses: Normal pulses.     Heart sounds: Normal heart sounds. No murmur heard. No gallop.   Pulmonary:     Effort: Pulmonary effort is normal. No respiratory distress.     Breath sounds: Normal breath sounds. No wheezing, rhonchi or rales.  Skin:    General: Skin is warm and dry.  Neurological:     Mental Status: She is alert and oriented to [name], place, and time.  Psychiatric:        Behavior: Behavior normal.     There were no vitals taken for this visit. Wt Readings from Last 3 Encounters:  09/17/20 137 lb (62.1 kg)  08/11/20 135 lb (61.2 kg)  06/23/20 139 lb (63 kg)       Assessment & Plan:   Problem List Items Addressed This Visit   None      No orders of the defined types were placed in this encounter.   I, [name], personally preformed the services described in this documentation.  All medical record entries made by the scribe were at my direction and in my presence.  I have reviewed the chart and discharge instructions (if applicable) and agree that the record reflects my personal performance and is accurate and complete. 11/13/2020   I,Shehryar Baig,acting as a Neurosurgeon for Lemont Fillers, NP.,have documented all relevant documentation on the behalf of Lemont Fillers, NP,as directed by  Lemont Fillers, NP while in the presence of Lemont Fillers, NP.   Shehryar [employer]

## 2020-11-20 ENCOUNTER — Other Ambulatory Visit: Payer: Self-pay

## 2020-11-20 ENCOUNTER — Encounter: Payer: Self-pay | Admitting: Family

## 2020-11-20 ENCOUNTER — Telehealth: Payer: Self-pay | Admitting: Family

## 2020-11-20 ENCOUNTER — Ambulatory Visit: Payer: Federal, State, Local not specified - PPO | Admitting: Family

## 2020-11-20 DIAGNOSIS — Z720 Tobacco use: Secondary | ICD-10-CM

## 2020-11-20 DIAGNOSIS — R232 Flushing: Secondary | ICD-10-CM | POA: Diagnosis not present

## 2020-11-20 DIAGNOSIS — F411 Generalized anxiety disorder: Secondary | ICD-10-CM | POA: Diagnosis not present

## 2020-11-20 DIAGNOSIS — H6012 Cellulitis of left external ear: Secondary | ICD-10-CM | POA: Diagnosis not present

## 2020-11-20 MED ORDER — GABAPENTIN 300 MG PO CAPS: 300.0000 mg | ORAL_CAPSULE | Freq: Every day | ORAL | 1 refills | Status: DC

## 2020-11-20 NOTE — Assessment & Plan Note (Signed)
Discussed smoking cessation. Discussed that we could try adding wellbutrin when she is ready. She will think about it.

## 2020-11-20 NOTE — Telephone Encounter (Signed)
Please call [facility] office and request a copy of her mammogram.

## 2020-11-20 NOTE — Assessment & Plan Note (Signed)
She does have some ongoing depression/anxiety symptoms. They seemed better at the 30mg  dose of citalopram. She will try going back up to 30mg  and taking her zyrtec in the AM instead of PM as this also seems to make her drowsy.

## 2020-11-20 NOTE — Progress Notes (Signed)
Subjective:   By signing my name below, I, Christie [name], attest that this documentation has been prepared under the direction and in the presence of Christie Craze NP. 11/20/2020    Patient ID: Christie [name], female    DOB: 1965-11-15, 55 y.o.   MRN: [id number]  Chief Complaint  Patient presents with   Anxiety    "Doing well, need refill on Gabapentin"    HPI Patient is in today for a office visit.  She reports having a bacterial infection in her ear that she as in the hospital for. She is requesting a refill for 300 mg gabapentin daily PO.   Cigarettes- She continues to smoke at this time. She tried using a patch but notes that she gets skin irritation when using the patch. She is willing to try Wellbutrin to manage her cravings. Anxiety- During last visit she increased her citalopram dosage from 20 mg to 30 mg. She notes that while taking 30 mg citalopram PO and 300 mg gabapentin daily PO she felt drowsy. She is taking gabapentin to manage her hot flashes. Allergies- She continues to have mild allergies. She takes OTC allergy medication to manage her symptoms. Immunization- She is not interested in getting the hepatitis C vaccine. She is not interested in getting the shingles vaccine at this time.  Pap smear- Her pap smear is UTD Mammogram- Her mammogram is UTD. She gets her mammogram and pap smear done by her GYN.   Health Maintenance Due  Topic Date Due   Pneumococcal Vaccine 66-36 Years old (1 - PCV) Never done   COVID-19 Vaccine (4 - Booster for [employer] series) 07/29/2020    Past Medical History:  Diagnosis Date   Anxiety     Past Surgical History:  Procedure Laterality Date   ABLATION  2010   uterine   HYSTEROSCOPY  2010    per patient    Family History  Problem Relation Age of Onset   Hypertension Mother    Heart failure Mother    Lung cancer Mother        s/p resection   Diabetes Maternal Grandmother        type II   Hypertension Maternal  Grandmother    Alcohol abuse Other        family hx   Anxiety disorder Other        family hx   Arthritis Other        family hx   Hypertension Other        family hx   Thyroid disease Other        family hx   Gallstones Brother    Hypertension Brother     Social History   Socioeconomic History   Marital status: Married    Spouse name: Not on file   Number of children: Not on file   Years of education: Not on file   Highest education level: Not on file  Occupational History   Not on file  Tobacco Use   Smoking status: Every Day    Pack years: 0.00    Types: Cigarettes   Smokeless tobacco: Never   Tobacco comments:    10 cigarettes daily  Substance and Sexual Activity   Alcohol use: Yes    Alcohol/week: 1.0 standard drink    Types: 1 Glasses of wine per week    Comment: daily 1-2 glass wine/day   Drug use: Yes    Frequency: 7.0 times per week    Types:  Marijuana   Sexual activity: Yes    Birth control/protection: None  Other Topics Concern   Not on file  Social History Narrative   ** Merged History Encounter **       Social Determinants of Health   Financial Resource Strain: Not on file  Food Insecurity: Not on file  Transportation Needs: Not on file  Physical Activity: Not on file  Stress: Not on file  Social Connections: Not on file  Intimate Partner Violence: Not on file    Outpatient Medications Prior to Visit  Medication Sig Dispense Refill   betamethasone dipropionate 0.05 % cream APPLY TOPICALLY 2 TIMES DAILY AS NEEDED 30 g 1   CALCIUM-VITAMIN D PO Take 1 capsule by mouth daily.     cetirizine (ZYRTEC) 10 MG tablet Take 1 tablet (10 mg total) by mouth daily. 30 tablet 11   citalopram (CELEXA) 20 MG tablet Take 1.5 tablets (30 mg total) by mouth daily. 135 tablet 1   diclofenac sodium (VOLTAREN) 1 % GEL Apply 2 g topically 4 (four) times daily. To affected joint. 100 g 11   EYSUVIS 0.25 % SUSP Place 1 drop into both eyes daily.     fluticasone  (FLONASE) 50 MCG/ACT nasal spray USE 2 SPRAYS IN TO EACH NOSTRIL DAILY AS DIRECTED (Patient taking differently: Place 2 sprays into both nostrils daily as needed for allergies.) 48 g 5   sulfamethoxazole-trimethoprim (BACTRIM DS) 800-160 MG tablet Take 1 tablet by mouth 2 (two) times daily. 16 tablet 0   gabapentin (NEURONTIN) 300 MG capsule Take 1 capsule (300 mg total) by mouth at bedtime. 90 capsule 1   No facility-administered medications prior to visit.    No Known Allergies  ROS See HPI    Objective:    Physical Exam Constitutional:      General: She is not in acute distress.    Appearance: Normal appearance. She is not ill-appearing.  HENT:     Head: Normocephalic and atraumatic.     Right Ear: External ear normal.     Left Ear: External ear normal.  Eyes:     Extraocular Movements: Extraocular movements intact.     Pupils: Pupils are equal, round, and reactive to light.  Cardiovascular:     Rate and Rhythm: Normal rate and regular rhythm.     Pulses: Normal pulses.     Heart sounds: Normal heart sounds. No murmur heard.   No gallop.  Pulmonary:     Effort: Pulmonary effort is normal. No respiratory distress.     Breath sounds: Normal breath sounds. No wheezing, rhonchi or rales.  Skin:    General: Skin is warm and dry.  Neurological:     Mental Status: She is alert and oriented to [name], place, and time.  Psychiatric:        Behavior: Behavior normal.    BP 118/80 (BP Location: Right Arm, Patient Position: Sitting, Cuff Size: Small)   Pulse 62   Temp 98.5 F (36.9 C) (Oral)   Resp 16   Wt 134 lb (60.8 kg)   SpO2 99%   BMI 23.00 kg/m  Wt Readings from Last 3 Encounters:  11/20/20 134 lb (60.8 kg)  09/17/20 137 lb (62.1 kg)  08/11/20 135 lb (61.2 kg)       Assessment & Plan:   Problem List Items Addressed This Visit       Unprioritized   Tobacco abuse    Discussed smoking cessation. Discussed that we could try adding  wellbutrin when she is ready.  She will think about it.        Hot flashes    Improved with the addition of gabapentin 300mg  PO HS.  Continue same.        Cellulitis of auricle of left ear    Had hospitalization for this back in April. Clinically resolved.        Anxiety state    She does have some ongoing depression/anxiety symptoms. They seemed better at the 30mg  dose of citalopram. She will try going back up to 30mg  and taking her zyrtec in the AM instead of PM as this also seems to make her drowsy.          Meds ordered this encounter  Medications   gabapentin (NEURONTIN) 300 MG capsule    Sig: Take 1 capsule (300 mg total) by mouth at bedtime.    Dispense:  90 capsule    Refill:  1    Order Specific Question:   Supervising Provider    Answer:   May A [[id number]]    I, NP, personally preformed the services described in this documentation.  All medical record entries made by the scribe were at my direction and in my presence.  I have reviewed the chart and discharge instructions (if applicable) and agree that the record reflects my personal performance and is accurate and complete. 11/20/2020   I,Christie [name],acting as a Danise Edge for Christie Craze, NP.,have documented all relevant documentation on the behalf of 01/20/2021, NP,as directed by  Neurosurgeon, NP while in the presence of Lemont Fillers, NP.   Lemont Fillers, NP

## 2020-11-20 NOTE — Assessment & Plan Note (Signed)
Improved with the addition of gabapentin 300mg  PO HS.  Continue same.

## 2020-11-20 NOTE — Telephone Encounter (Signed)
Records release will be fax 

## 2020-11-20 NOTE — Assessment & Plan Note (Signed)
Had hospitalization for this back in April. Clinically resolved.

## 2020-11-27 ENCOUNTER — Telehealth: Payer: Self-pay | Admitting: Family

## 2020-11-27 DIAGNOSIS — E042 Nontoxic multinodular goiter: Secondary | ICD-10-CM

## 2020-11-27 NOTE — Telephone Encounter (Signed)
Please advise pt that I would recommend that she repeat thyroid US to make sure her thyroid nodules are stable in size. I have pended order.

## 2020-11-27 NOTE — Telephone Encounter (Signed)
Lvm for patient to call back about this °

## 2020-11-30 NOTE — Telephone Encounter (Signed)
Lvm again today 

## 2020-12-03 NOTE — Telephone Encounter (Signed)
Called patient again today and advised her of follow up thyroid US. She  verbalized agreeing with plan,.

## 2020-12-15 DIAGNOSIS — H04123 Dry eye syndrome of bilateral lacrimal glands: Secondary | ICD-10-CM | POA: Diagnosis not present

## 2020-12-29 ENCOUNTER — Other Ambulatory Visit: Payer: Self-pay | Admitting: Family

## 2021-01-19 ENCOUNTER — Ambulatory Visit: Payer: Federal, State, Local not specified - PPO | Admitting: Internal Medicine

## 2021-01-30 DIAGNOSIS — H1013 Acute atopic conjunctivitis, bilateral: Secondary | ICD-10-CM | POA: Diagnosis not present

## 2021-02-12 ENCOUNTER — Ambulatory Visit: Payer: Federal, State, Local not specified - PPO | Admitting: Family

## 2021-02-23 ENCOUNTER — Ambulatory Visit: Payer: Federal, State, Local not specified - PPO | Admitting: Internal Medicine

## 2021-02-23 ENCOUNTER — Other Ambulatory Visit: Payer: Self-pay

## 2021-02-23 ENCOUNTER — Encounter: Payer: Self-pay | Admitting: Internal Medicine

## 2021-02-23 VITALS — BP 120/70 | HR 73 | Ht 64.0 in | Wt 129.0 lb

## 2021-02-23 DIAGNOSIS — E042 Nontoxic multinodular goiter: Secondary | ICD-10-CM

## 2021-02-23 NOTE — Progress Notes (Signed)
Name: Christie [name]  MRN/ DOB: [id number], April 15, 1966    Age/ Sex: 55 y.o., female     PCP: Sandford Craze, NP   Reason for Endocrinology Evaluation: MNG     Initial Endocrinology Clinic Visit: 8/3/201    PATIENT IDENTIFIER: Christie [name] is a 55 y.o., female with a past medical history of MNG, GAD and allergic rhinitis. She has followed with [facility] since 01/14/2020 for consultative assistance with management of her MNG.   HISTORICAL SUMMARY:   Pt was noted to have thyromegaly durin routine physical exam which prompted a thyroid ultrasound revealing MNG. She is S/P benign FNA of the left inferior nodule 12/2019.      She had hysteroscopy   No FH of venothrombotic events No FH of breast and ovarian cancer    She was offered estrogen patches in the past but she is a smoker.      Mother with thyroid disease    SUBJECTIVE:    Today (02/23/2021):  Christie [name] is here for MNG Denies local neck swelling of dysphagia  She continues with hot flashes   She has bee noted with weight loss Denies palpitations  Has alternating bowel movements.  Has occasional tremors     HISTORY:  Past Medical History:  Past Medical History:  Diagnosis Date   Anxiety    Past Surgical History:  Past Surgical History:  Procedure Laterality Date   ABLATION  2010   uterine   HYSTEROSCOPY  2010    per patient   Social History:  reports that she has been smoking cigarettes. She has never used smokeless tobacco. She reports current alcohol use of about 1.0 standard drink per week. She reports current drug use. Frequency: 7.00 times per week. Drug: Marijuana. Family History:  Family History  Problem Relation Age of Onset   Hypertension Mother    Heart failure Mother    Lung cancer Mother        s/p resection   Diabetes Maternal Grandmother        type II   Hypertension Maternal Grandmother    Alcohol abuse Other        family hx    Anxiety disorder Other        family hx   Arthritis Other        family hx   Hypertension Other        family hx   Thyroid disease Other        family hx   Gallstones Brother    Hypertension Brother      HOME MEDICATIONS: Allergies as of 02/23/2021   No Known Allergies      Medication List        Accurate as of February 23, 2021 11:35 AM. If you have any questions, ask your nurse or doctor.          betamethasone dipropionate 0.05 % cream APPLY TOPICALLY 2 TIMES DAILY AS NEEDED   CALCIUM-VITAMIN D PO Take 1 capsule by mouth daily.   cetirizine 10 MG tablet Commonly known as: ZYRTEC Take 1 tablet (10 mg total) by mouth daily.   citalopram 20 MG tablet Commonly known as: CELEXA TAKE 1 AND 1/2 TABLETS DAILY BY MOUTH   diclofenac sodium 1 % Gel Commonly known as: VOLTAREN Apply 2 g topically 4 (four) times daily. To affected joint.   Eysuvis 0.25 % Susp Generic drug: Loteprednol Etabonate Place 1 drop into both eyes daily.   fluticasone 50  MCG/ACT nasal spray Commonly known as: FLONASE USE 2 SPRAYS IN TO EACH NOSTRIL DAILY AS DIRECTED What changed:  how much to take how to take this when to take this reasons to take this additional instructions   gabapentin 300 MG capsule Commonly known as: NEURONTIN Take 1 capsule (300 mg total) by mouth at bedtime.   sulfamethoxazole-trimethoprim 800-160 MG tablet Commonly known as: BACTRIM DS Take 1 tablet by mouth 2 (two) times daily.          OBJECTIVE:   PHYSICAL EXAM: VS: BP 120/70 (BP Location: Left Arm, Patient Position: Sitting, Cuff Size: Small)   Pulse 73   Ht 5\' 4"  (1.626 m)   Wt 129 lb (58.5 kg)   SpO2 98%   BMI 22.14 kg/m    EXAM: General: Pt appears well and is in NAD  Neck: General: Supple without adenopathy. Thyroid: Thyroid size normal.  Left thyroid nodules appreciated  Lungs: Clear with good BS bilat with no rales, rhonchi, or wheezes  Heart: Auscultation: RRR.  Abdomen:  Normoactive bowel sounds, soft, nontender, without masses or organomegaly palpable  Extremities:  BL LE: No pretibial edema  Mental Status: Judgment, insight: Intact Orientation: Oriented to time, place, and person Mood and affect: No depression, anxiety, or agitation     DATA REVIEWED:  Results for BRAYAH, URQUILLA (MRN Bernadene Person) as of 02/24/2021 15:47  Ref. Range 02/23/2021 16:12  TSH Latest Ref Range: 0.35 - 5.50 uIU/mL 0.95  T4,Free(Direct) Latest Ref Range: 0.60 - 1.60 ng/dL 02/25/2021      Thyroid ultrasound 12/13/2019   Estimated total number of nodules >/= 1 cm: 2   Number of spongiform nodules >/=  2 cm not described below (TR1): 0   Number of mixed cystic and solid nodules >/= 1.5 cm not described below (TR2): 0   _________________________________________________________   Nodule # 1:   Location: Right; Inferior   Maximum size: 1.6 cm; Other 2 dimensions: 0.8 x 1.1 cm   Composition: solid/almost completely solid (2)   Echogenicity: isoechoic (1)   Shape: not taller-than-wide (0)   Margins: ill-defined (0)   Echogenic foci: none (0)   ACR TI-RADS total points: 3.   ACR TI-RADS risk category: TR3 (3 points).   ACR TI-RADS recommendations:   *Given size (>/= 1.5 - 2.4 cm) and appearance, a follow-up ultrasound in 1 year should be considered based on TI-RADS criteria.   _________________________________________________________   Nodule # 2:   Location: Left; Inferior   Maximum size: 3.0 cm; Other 2 dimensions: 1.6 x 1.7 cm   Composition: solid/almost completely solid (2)   Echogenicity: isoechoic (1)   Shape: not taller-than-wide (0)   Margins: ill-defined (0)   Echogenic foci: none (0)   ACR TI-RADS total points: 3.   ACR TI-RADS risk category: TR3 (3 points).   ACR TI-RADS recommendations:   **Given size (>/= 2.5 cm) and appearance, fine needle aspiration of this mildly suspicious nodule should be considered based on  TI-RADS criteria.   _________________________________________________________   There are a few additional benign scattered subcentimeter cystic nodules noted. No hypervascularity. No regional adenopathy.   IMPRESSION: 3.0 cm left inferior TR 3 nodule meets criteria for biopsy as above.   1.6 cm right inferior TR 3 nodule meets criteria follow-up in 1 year.     Left inferior FNA 12/24/2019   FINAL MICROSCOPIC DIAGNOSIS:  - Consistent with benign follicular nodule ([employer] category II)   ASSESSMENT / PLAN / RECOMMENDATIONS:   MNG:   -  Patient is clinically and biochemically euthyroid -No local neck symptoms - S/P benign FNA of left inferior nodule, reassurance provided -Repeat ultrasound has been ordered   Follow-up in 1 year   Signed electronically by: Lyndle Herrlich, MD  [facility] End[facility]  [facility] [address] [place] [place], [place] [address] Phone: [phone] FAX: [phone]      CC: Sandford Craze, NP [address] [place] [address] Phone: [phone]  Fax: [phone]   Return to Endocrinology clinic as below: Future Appointments  Date Time Provider Department Center  02/23/2021  3:40 PM Sakari Alkhatib, Konrad Dolores, MD [facility]  03/02/2021 10:00 AM Sandford Craze, NP [facility]  05/25/2021 11:00 AM Sandford Craze, NP [facility]

## 2021-02-24 LAB — TSH: TSH: 0.95 u[IU]/mL (ref 0.35–5.50)

## 2021-02-24 LAB — T4, FREE: Free T4: 0.69 ng/dL (ref 0.60–1.60)

## 2021-03-02 ENCOUNTER — Other Ambulatory Visit: Payer: Self-pay

## 2021-03-02 ENCOUNTER — Ambulatory Visit: Payer: Federal, State, Local not specified - PPO | Admitting: Family

## 2021-03-02 VITALS — BP 120/87 | HR 72 | Temp 98.4°F | Resp 16 | Ht 64.0 in | Wt 126.6 lb

## 2021-03-02 DIAGNOSIS — Z720 Tobacco use: Secondary | ICD-10-CM | POA: Diagnosis not present

## 2021-03-02 DIAGNOSIS — L309 Dermatitis, unspecified: Secondary | ICD-10-CM

## 2021-03-02 DIAGNOSIS — Z23 Encounter for immunization: Secondary | ICD-10-CM | POA: Diagnosis not present

## 2021-03-02 DIAGNOSIS — F411 Generalized anxiety disorder: Secondary | ICD-10-CM

## 2021-03-02 DIAGNOSIS — R232 Flushing: Secondary | ICD-10-CM | POA: Diagnosis not present

## 2021-03-02 MED ORDER — VARENICLINE TARTRATE 0.5 MG X 11 & 1 MG X 42 PO MISC: ORAL | 0 refills | Status: DC

## 2021-03-02 MED ORDER — BETAMETHASONE DIPROPIONATE 0.05 % EX CREA: TOPICAL_CREAM | CUTANEOUS | 1 refills | Status: DC

## 2021-03-02 MED ORDER — CITALOPRAM HYDROBROMIDE 20 MG PO TABS
ORAL_TABLET | ORAL | 1 refills | Status: DC
Start: 2021-03-02 — End: 2022-03-18

## 2021-03-02 NOTE — Patient Instructions (Signed)
Please start chantix.   Send me a note in about 3 weeks to let me know how you are doing on the chantix.

## 2021-03-02 NOTE — Progress Notes (Signed)
Subjective:   By signing my name below, I, Christie Christie [name], attest that this documentation has been prepared under the direction and in the presence of Sandford Craze, NP, 03/02/2021   Patient ID: Christie Christie [name], female    DOB: [birth year], 55 y.o.   MRN: [id number]  Chief Complaint  Patient presents with   Anxiety    Here for follow up    HPI Patient is in today for an office visit.  Medication: During her last visit her celexa was increased to 30 mg. She has denied any adverse reactions to this change. She is using 300 mg gabapentin at night time to control her hot flashes and notes she has seen improvement. She uses 1% Voltaren gel on her elbow which she accompanies with Tylenol when her symptoms worsen.  Smoking: She is still smoking at this time but is interested in quitting. She is interested in using Chantix to help her quit smoking.  Exercise: She has began dancing again.  Diet: She notes she is eating well and consistently but does note some bowel issues.  Vaccinations: She has received her flu shot during this visit and is interested in receiving the updated Covid-19 booster.  Anxiety: She complains of anxiety that has been persistent but notes this is due to stress in her life.   Health Maintenance Due  Topic Date Due   COVID-19 Vaccine (4 - Booster for [employer] series) 07/29/2020    Past Medical History:  Diagnosis Date   Anxiety     Past Surgical History:  Procedure Laterality Date   ABLATION  2010   uterine   HYSTEROSCOPY  2010    per patient    Family History  Problem Relation Age of Onset   Hypertension Mother    Heart failure Mother    Lung cancer Mother        s/p resection   Diabetes Maternal Grandmother        type II   Hypertension Maternal Grandmother    Alcohol abuse Other        family hx   Anxiety disorder Other        family hx   Arthritis Other        family hx   Hypertension Other        family hx   Thyroid disease  Other        family hx   Gallstones Brother    Hypertension Brother     Social History   Socioeconomic History   Marital status: Married    Spouse name: Not on file   Number of children: Not on file   Years of education: Not on file   Highest education level: Not on file  Occupational History   Not on file  Tobacco Use   Smoking status: Every Day    Types: Cigarettes   Smokeless tobacco: Never   Tobacco comments:    10 cigarettes daily  Substance and Sexual Activity   Alcohol use: Yes    Alcohol/week: 1.0 standard drink    Types: 1 Glasses of wine per week    Comment: daily 1-2 glass wine/day   Drug use: Yes    Frequency: 7.0 times per week    Types: Marijuana   Sexual activity: Yes    Birth control/protection: None  Other Topics Concern   Not on file  Social History Narrative   ** Merged History Encounter **       Social Determinants of Health  Financial Resource Strain: Not on file  Food Insecurity: Not on file  Transportation Needs: Not on file  Physical Activity: Not on file  Stress: Not on file  Social Connections: Not on file  Intimate Partner Violence: Not on file    Outpatient Medications Prior to Visit  Medication Sig Dispense Refill   CALCIUM-VITAMIN D PO Take 1 capsule by mouth daily.     cetirizine (ZYRTEC) 10 MG tablet Take 1 tablet (10 mg total) by mouth daily. 30 tablet 11   diclofenac sodium (VOLTAREN) 1 % GEL Apply 2 g topically 4 (four) times daily. To affected joint. 100 g 11   EYSUVIS 0.25 % SUSP Place 1 drop into both eyes daily.     fluticasone (FLONASE) 50 MCG/ACT nasal spray USE 2 SPRAYS IN TO EACH NOSTRIL DAILY AS DIRECTED (Patient taking differently: Place 2 sprays into both nostrils daily as needed for allergies.) 48 g 5   gabapentin (NEURONTIN) 300 MG capsule Take 1 capsule (300 mg total) by mouth at bedtime. 90 capsule 1   betamethasone dipropionate 0.05 % cream APPLY TOPICALLY 2 TIMES DAILY AS NEEDED 30 g 1   citalopram (CELEXA)  20 MG tablet TAKE 1 AND 1/2 TABLETS DAILY BY MOUTH 135 tablet 1   sulfamethoxazole-trimethoprim (BACTRIM DS) 800-160 MG tablet Take 1 tablet by mouth 2 (two) times daily. 16 tablet 0   No facility-administered medications prior to visit.    No Known Allergies  Review of Systems  Skin:        (+) eczema on elbows  Psychiatric/Behavioral:  The patient is nervous/anxious.       Objective:    Physical Exam Constitutional:      General: She is not in acute distress.    Appearance: She is not ill-appearing.  HENT:     Head: Normocephalic and atraumatic.     Right Ear: External ear normal.     Left Ear: External ear normal.  Eyes:     Extraocular Movements: Extraocular movements intact.     Pupils: Pupils are equal, round, and reactive to light.  Cardiovascular:     Rate and Rhythm: Normal rate and regular rhythm.     Heart sounds: Normal heart sounds. No murmur heard.   No gallop.  Pulmonary:     Effort: Pulmonary effort is normal. No respiratory distress.     Breath sounds: Normal breath sounds. No wheezing or rales.  Skin:    General: Skin is warm and dry.  Neurological:     Mental Status: She is alert and oriented to Christie [name], place, and time.  Psychiatric:        Behavior: Behavior normal.        Judgment: Judgment normal.    BP 120/87 (BP Location: Right Arm, Patient Position: Sitting, Cuff Size: Small)   Pulse 72   Temp 98.4 F (36.9 C) (Oral)   Resp 16   Ht 5\' 4"  (1.626 m)   Wt 126 lb 9.6 oz (57.4 kg)   SpO2 100%   BMI 21.73 kg/m  Wt Readings from Last 3 Encounters:  03/02/21 126 lb 9.6 oz (57.4 kg)  02/23/21 129 lb (58.5 kg)  11/20/20 134 lb (60.8 kg)       Assessment & Plan:   Problem List Items Addressed This Visit       Unprioritized   Tobacco abuse    Trial of chantix. Common side effects including rare risk of suicide ideation was discussed with the patient today.  Patient  is instructed to go directly to the ED if this occurs.  We discussed that  patient can continue to smoke for 1 week after starting chantix, but then must discontinue cigarettes.  He is also instructed to contact us prior to completion of the starter month pack for an rx for the continuation month pack.  5 minutes spent with patient today on tobacco cessation counseling.        Hot flashes    Stable symptoms. Continue citalopram 30mg  and gabapentin 300mg  HS.       Eczema    Request refill on her topical steroid for eczema on elbows.      Anxiety state   Relevant Medications   citalopram (CELEXA) 20 MG tablet   Other Visit Diagnoses     Needs flu shot    -  Primary   Relevant Orders   Flu Vaccine QUAD 6+ mos PF IM (Fluarix Quad PF) (Completed)       Meds ordered this encounter  Medications   betamethasone dipropionate 0.05 % cream    Sig: Apply twice daily as needed    Dispense:  30 g    Refill:  1    Order Specific Question:   Supervising Provider    Answer:   A [[id number]]   citalopram (CELEXA) 20 MG tablet    Sig: TAKE 1 AND 1/2 TABLETS DAILY BY MOUTH    Dispense:  135 tablet    Refill:  1    Order Specific Question:   Supervising Provider    Answer:   A [[id number]]   varenicline (CHANTIX PAK) 0.5 MG X 11 & 1 MG X 42 tablet    Sig: Take one 0.5 mg tablet by mouth once daily for 3 days, then increase to one 0.5 mg tablet twice daily for 4 days, then increase to one 1 mg tablet twice daily.    Dispense:  53 tablet    Refill:  0    Order Specific Question:   Supervising Provider    Answer:   Danise Edge A [[id number]]    I, Danise Edge, NP, personally preformed the services described in this documentation.  All medical record entries made by the scribe were at my direction and in my presence.  I have reviewed the chart and discharge instructions (if applicable) and agree that the record reflects my personal performance and is accurate and complete. 03/02/2021  I,Christie Christie [name],acting as a Sandford Craze for 03/04/2021, NP.,have documented all relevant documentation on the behalf of Neurosurgeon, NP,as directed by  Lemont Fillers, NP while in the presence of Lemont Fillers, NP.     Lemont Fillers, NP

## 2021-03-04 NOTE — Assessment & Plan Note (Signed)
Request refill on her topical steroid for eczema on elbows.

## 2021-03-04 NOTE — Assessment & Plan Note (Signed)
Stable symptoms. Continue citalopram 30mg  and gabapentin 300mg  HS.

## 2021-03-04 NOTE — Assessment & Plan Note (Signed)
Trial of chantix. Common side effects including rare risk of suicide ideation was discussed with the patient today.  Patient is instructed to go directly to the ED if this occurs.  We discussed that patient can continue to smoke for 1 week after starting chantix, but then must discontinue cigarettes.  He is also instructed to contact us prior to completion of the starter month pack for an rx for the continuation month pack.  5 minutes spent with patient today on tobacco cessation counseling.   

## 2021-03-22 DIAGNOSIS — H04123 Dry eye syndrome of bilateral lacrimal glands: Secondary | ICD-10-CM | POA: Diagnosis not present

## 2021-04-30 ENCOUNTER — Ambulatory Visit (HOSPITAL_BASED_OUTPATIENT_CLINIC_OR_DEPARTMENT_OTHER): Payer: Federal, State, Local not specified - PPO

## 2021-05-14 ENCOUNTER — Ambulatory Visit (HOSPITAL_BASED_OUTPATIENT_CLINIC_OR_DEPARTMENT_OTHER)
Admission: RE | Admit: 2021-05-14 | Discharge: 2021-05-14 | Disposition: A | Discharge status: Home / Self Care | Payer: Federal, State, Local not specified - PPO | Source: Ambulatory Visit | Attending: Internal Medicine | Admitting: Internal Medicine

## 2021-05-14 ENCOUNTER — Other Ambulatory Visit: Payer: Self-pay

## 2021-05-14 DIAGNOSIS — E042 Nontoxic multinodular goiter: Secondary | ICD-10-CM | POA: Diagnosis not present

## 2021-05-14 DIAGNOSIS — E041 Nontoxic single thyroid nodule: Secondary | ICD-10-CM | POA: Diagnosis not present

## 2021-05-14 DIAGNOSIS — H1013 Acute atopic conjunctivitis, bilateral: Secondary | ICD-10-CM | POA: Diagnosis not present

## 2021-05-18 DIAGNOSIS — Z113 Encounter for screening for infections with a predominantly sexual mode of transmission: Secondary | ICD-10-CM | POA: Diagnosis not present

## 2021-05-18 DIAGNOSIS — N898 Other specified noninflammatory disorders of vagina: Secondary | ICD-10-CM | POA: Diagnosis not present

## 2021-05-18 DIAGNOSIS — N760 Acute vaginitis: Secondary | ICD-10-CM | POA: Diagnosis not present

## 2021-05-18 DIAGNOSIS — Z202 Contact with and (suspected) exposure to infections with a predominantly sexual mode of transmission: Secondary | ICD-10-CM | POA: Diagnosis not present

## 2021-05-25 ENCOUNTER — Ambulatory Visit: Payer: Federal, State, Local not specified - PPO | Admitting: Family

## 2021-05-25 DIAGNOSIS — F411 Generalized anxiety disorder: Secondary | ICD-10-CM | POA: Diagnosis not present

## 2021-05-25 DIAGNOSIS — Z720 Tobacco use: Secondary | ICD-10-CM

## 2021-05-25 DIAGNOSIS — R232 Flushing: Secondary | ICD-10-CM | POA: Diagnosis not present

## 2021-05-25 DIAGNOSIS — E042 Nontoxic multinodular goiter: Secondary | ICD-10-CM | POA: Diagnosis not present

## 2021-05-25 NOTE — Progress Notes (Signed)
Subjective:   By signing my name below, I, [name], attest that this documentation has been prepared under the direction and in the presence of Sandford Craze, NP, 05/25/2021     Patient ID: Christie [name], female    DOB: May 23, 1966, 55 y.o.   MRN: [id number]  Chief Complaint  Patient presents with   Follow-up    Here for 6 Month Follow Up    HPI Patient is in today for an office visit.  Smoking: She began taking 0.5 mg Chantix to help her quit smoking. She has been doing it consistently for one week and does note that it is helping but she has not quit smoking.  Hot flashes: She mentions that her hot flashes have improved since her last visit. Immunizations: She has already received her flu shot this season and is not interested in getting the bivalent Covid-19 booster at this time.  Anxiety- stable on citalopram. She reports being busy with dance this time of year which can be stressful.   Health Maintenance Due  Topic Date Due   Pneumococcal Vaccine 59-35 Years old (1 - PCV) Never done   COVID-19 Vaccine (4 - Booster for [employer] series) 05/23/2020    Past Medical History:  Diagnosis Date   Anxiety     Past Surgical History:  Procedure Laterality Date   ABLATION  2010   uterine   HYSTEROSCOPY  2010    per patient    Family History  Problem Relation Age of Onset   Hypertension Mother    Heart failure Mother    Lung cancer Mother        s/p resection   Diabetes Maternal Grandmother        type II   Hypertension Maternal Grandmother    Alcohol abuse Other        family hx   Anxiety disorder Other        family hx   Arthritis Other        family hx   Hypertension Other        family hx   Thyroid disease Other        family hx   Gallstones Brother    Hypertension Brother     Social History   Socioeconomic History   Marital status: Married    Spouse name: Not on file   Number of children: Not on file   Years of education: Not  on file   Highest education level: Not on file  Occupational History   Not on file  Tobacco Use   Smoking status: Every Day    Types: Cigarettes   Smokeless tobacco: Never   Tobacco comments:    10 cigarettes daily  Substance and Sexual Activity   Alcohol use: Yes    Alcohol/week: 1.0 standard drink    Types: 1 Glasses of wine per week    Comment: daily 1-2 glass wine/day   Drug use: Yes    Frequency: 7.0 times per week    Types: Marijuana   Sexual activity: Yes    Birth control/protection: None  Other Topics Concern   Not on file  Social History Narrative   ** Merged History Encounter **       Social Determinants of Health   Financial Resource Strain: Not on file  Food Insecurity: Not on file  Transportation Needs: Not on file  Physical Activity: Not on file  Stress: Not on file  Social Connections: Not on file  Intimate Partner  Violence: Not on file    Outpatient Medications Prior to Visit  Medication Sig Dispense Refill   betamethasone dipropionate 0.05 % cream Apply twice daily as needed 30 g 1   CALCIUM-VITAMIN D PO Take 1 capsule by mouth daily.     cetirizine (ZYRTEC) 10 MG tablet Take 1 tablet (10 mg total) by mouth daily. 30 tablet 11   citalopram (CELEXA) 20 MG tablet TAKE 1 AND 1/2 TABLETS DAILY BY MOUTH 135 tablet 1   diclofenac sodium (VOLTAREN) 1 % GEL Apply 2 g topically 4 (four) times daily. To affected joint. 100 g 11   EYSUVIS 0.25 % SUSP Place 1 drop into both eyes daily.     fluticasone (FLONASE) 50 MCG/ACT nasal spray USE 2 SPRAYS IN TO EACH NOSTRIL DAILY AS DIRECTED (Patient taking differently: Place 2 sprays into both nostrils daily as needed for allergies.) 48 g 5   gabapentin (NEURONTIN) 300 MG capsule Take 1 capsule (300 mg total) by mouth at bedtime. 90 capsule 1   varenicline (CHANTIX PAK) 0.5 MG X 11 & 1 MG X 42 tablet Take one 0.5 mg tablet by mouth once daily for 3 days, then increase to one 0.5 mg tablet twice daily for 4 days, then  increase to one 1 mg tablet twice daily. 53 tablet 0   No facility-administered medications prior to visit.    No Known Allergies  Review of Systems  Constitutional:        (-) hot flashes      Objective:    Physical Exam Constitutional:      General: She is not in acute distress.    Appearance: Normal appearance. She is not ill-appearing.  HENT:     Head: Normocephalic and atraumatic.     Right Ear: External ear normal.     Left Ear: External ear normal.  Eyes:     Extraocular Movements: Extraocular movements intact.     Pupils: Pupils are equal, round, and reactive to light.  Cardiovascular:     Rate and Rhythm: Normal rate and regular rhythm.     Heart sounds: Normal heart sounds. No murmur heard.   No gallop.  Pulmonary:     Effort: Pulmonary effort is normal. No respiratory distress.     Breath sounds: Normal breath sounds. No wheezing or rales.  Skin:    General: Skin is warm and dry.  Neurological:     Mental Status: She is alert and oriented to [name], place, and time.  Psychiatric:        Behavior: Behavior normal.        Judgment: Judgment normal.    BP 115/80 (BP Location: Right Arm, Patient Position: Sitting, Cuff Size: Small)    Pulse 64    Temp 98.2 F (36.8 C) (Oral)    Resp 16    Ht 5\' 4"  (1.626 m)    Wt 133 lb 9.6 oz (60.6 kg)    SpO2 100%    BMI 22.93 kg/m  Wt Readings from Last 3 Encounters:  05/25/21 133 lb 9.6 oz (60.6 kg)  03/02/21 126 lb 9.6 oz (57.4 kg)  02/23/21 129 lb (58.5 kg)       Assessment & Plan:   Problem List Items Addressed This Visit       Unprioritized   Tobacco abuse    She is tolerating chantix. She is completing week 1. Will plan to try to stop smoking next week.       Multinodular goiter  Followed by endocrinology. Most recent thyroid US was stable.       Hot flashes    Stable, continue gabapentin and citalopram.       Anxiety state    Stable on citalopram 30mg . Continue same.       No orders of the  defined types were placed in this encounter.   I, , NP, personally preformed the services described in this documentation.  All medical record entries made by the scribe were at my direction and in my presence.  I have reviewed the chart and discharge instructions (if applicable) and agree that the record reflects my personal performance and is accurate and complete. 05/25/2021  I,[name],acting as a 05/27/2021 for Neurosurgeon, NP.,have documented all relevant documentation on the behalf of Lemont Fillers, NP,as directed by  Lemont Fillers, NP while in the presence of Lemont Fillers, NP.  Lemont Fillers, NP

## 2021-05-25 NOTE — Assessment & Plan Note (Signed)
She is tolerating chantix. She is completing week 1. Will plan to try to stop smoking next week.

## 2021-05-25 NOTE — Assessment & Plan Note (Signed)
Stable, continue gabapentin and citalopram.

## 2021-05-25 NOTE — Patient Instructions (Signed)
Continue using Chantix to help with quitting smoking.  Schedule annual physical at earliest convenience.

## 2021-05-25 NOTE — Assessment & Plan Note (Signed)
Followed by endocrinology. Most recent thyroid US was stable.

## 2021-05-25 NOTE — Assessment & Plan Note (Signed)
Stable on citalopram 30mg . Continue same.

## 2021-06-01 ENCOUNTER — Encounter: Payer: Federal, State, Local not specified - PPO | Admitting: Family

## 2021-06-08 ENCOUNTER — Other Ambulatory Visit: Payer: Self-pay | Admitting: Family

## 2021-06-22 DIAGNOSIS — Z113 Encounter for screening for infections with a predominantly sexual mode of transmission: Secondary | ICD-10-CM | POA: Diagnosis not present

## 2021-07-16 DIAGNOSIS — H16143 Punctate keratitis, bilateral: Secondary | ICD-10-CM | POA: Diagnosis not present

## 2021-08-11 DIAGNOSIS — E785 Hyperlipidemia, unspecified: Secondary | ICD-10-CM

## 2021-08-11 HISTORY — DX: Hyperlipidemia, unspecified: E78.5

## 2021-08-17 ENCOUNTER — Ambulatory Visit (INDEPENDENT_AMBULATORY_CARE_PROVIDER_SITE_OTHER): Payer: Federal, State, Local not specified - PPO | Admitting: Family

## 2021-08-17 ENCOUNTER — Encounter: Payer: Self-pay | Admitting: Family

## 2021-08-17 VITALS — BP 104/72 | HR 60 | Temp 98.1°F | Resp 16 | Ht 64.0 in | Wt 121.0 lb

## 2021-08-17 DIAGNOSIS — Z0000 Encounter for general adult medical examination without abnormal findings: Secondary | ICD-10-CM | POA: Diagnosis not present

## 2021-08-17 DIAGNOSIS — R739 Hyperglycemia, unspecified: Secondary | ICD-10-CM | POA: Diagnosis not present

## 2021-08-17 DIAGNOSIS — M79676 Pain in unspecified toe(s): Secondary | ICD-10-CM | POA: Diagnosis not present

## 2021-08-17 DIAGNOSIS — R634 Abnormal weight loss: Secondary | ICD-10-CM | POA: Diagnosis not present

## 2021-08-17 DIAGNOSIS — E785 Hyperlipidemia, unspecified: Secondary | ICD-10-CM

## 2021-08-17 DIAGNOSIS — N289 Disorder of kidney and ureter, unspecified: Secondary | ICD-10-CM

## 2021-08-17 DIAGNOSIS — Z720 Tobacco use: Secondary | ICD-10-CM

## 2021-08-17 LAB — COMPREHENSIVE METABOLIC PANEL
ALT: 14 U/L (ref 0–35)
AST: 21 U/L (ref 0–37)
Albumin: 4.5 g/dL (ref 3.5–5.2)
Alkaline Phosphatase: 64 U/L (ref 39–117)
BUN: 20 mg/dL (ref 6–23)
CO2: 28 mEq/L (ref 19–32)
Calcium: 10.1 mg/dL (ref 8.4–10.5)
Chloride: 104 mEq/L (ref 96–112)
Creatinine, Ser: 1.05 mg/dL (ref 0.40–1.20)
GFR: 59.53 mL/min — ABNORMAL LOW (ref >60.00)
Glucose, Bld: 82 mg/dL (ref 70–99)
Potassium: 4.1 mEq/L (ref 3.5–5.1)
Sodium: 139 mEq/L (ref 135–145)
Total Bilirubin: 0.5 mg/dL (ref 0.2–1.2)
Total Protein: 6.6 g/dL (ref 6.0–8.3)

## 2021-08-17 LAB — HEMOGLOBIN A1C: Hgb A1c MFr Bld: 6.1 % (ref 4.6–6.5)

## 2021-08-17 LAB — LIPID PANEL
Cholesterol: 215 mg/dL — ABNORMAL HIGH (ref 0–200)
HDL: 92.6 mg/dL (ref >39.00)
LDL Cholesterol: 110 mg/dL — ABNORMAL HIGH (ref 0–99)
NonHDL: 122.4
Total CHOL/HDL Ratio: 2
Triglycerides: 62 mg/dL (ref 0.0–149.0)
VLDL: 12.4 mg/dL (ref 0.0–40.0)

## 2021-08-17 LAB — TSH: TSH: 1.4 u[IU]/mL (ref 0.35–5.50)

## 2021-08-17 MED ORDER — VARENICLINE TARTRATE 0.5 MG X 11 & 1 MG X 42 PO TBPK: ORAL_TABLET | ORAL | 0 refills | Status: DC

## 2021-08-17 NOTE — Progress Notes (Signed)
Subjective:   By signing my name below, I, Christie [name], attest that this documentation has been prepared under the direction and in the presence of Christie Craze NP, 08/17/2021   Patient ID: Christie [name], female    DOB: [birth year], 56 y.o.   MRN: [id number]  Chief Complaint  Patient presents with   Annual Exam         HPI Patient is in today for a comprehensive physical exam.  Right Toe - Patient is concerned about potentially fracturing her right toe while dancing. She reports swelling of her right middle toe. She buddy taped her toe and it is feeling better.   Smoking - Patient reports using Chantix Pak for two weeks then stopped. She then relapsed after discontinuation of Chantix Pak. While she was using the medication she reports she didn't smoke as frequently.   Social History - No changes to family history. She is smoking 10 cigarettes a day. She is still working as a Teacher, adult education. She is still married with children and grandchildren. During her free time, she likes to be social.   Pap Smear - Last completed 08/05/2020  Mammogram - Last completed 08/04/2020. She is scheduled for a mammogram. Immunizations - declines shingrix, will consider bivalent covid booster.  Diet/Exercise - She eats between the times of 12 pm - 8 pm. She wakes up around 7 am. She is recommended to begin eating breakfast. She continues to exercise by dancing.  Wt Readings from Last 3 Encounters:  08/17/21 121 lb (54.9 kg)  05/25/21 133 lb 9.6 oz (60.6 kg)  03/02/21 126 lb 9.6 oz (57.4 kg)    She denies having any fever, ear pain, new muscle pain, joint pain, new moles, congestion, sinus pain, sore throat, palpations, wheezing, n/v/d, constipation, blood in stool, dysuria, frequency, hematuria at this time.   Health Maintenance Due  Topic Date Due   COVID-19 Vaccine (4 - Booster for [employer] series) 05/23/2020    Past Medical History:  Diagnosis Date   Anxiety     Past  Surgical History:  Procedure Laterality Date   ABLATION  2010   uterine   HYSTEROSCOPY  2010    per patient    Family History  Problem Relation Age of Onset   Hypertension Mother    Heart failure Mother    Lung cancer Mother        s/p resection   Diabetes Maternal Grandmother        type II   Hypertension Maternal Grandmother    Alcohol abuse Other        family hx   Anxiety disorder Other        family hx   Arthritis Other        family hx   Hypertension Other        family hx   Thyroid disease Other        family hx   Gallstones Brother    Hypertension Brother     Social History   Socioeconomic History   Marital status: Married    Spouse name: Not on file   Number of children: Not on file   Years of education: Not on file   Highest education level: Not on file  Occupational History   Not on file  Tobacco Use   Smoking status: Every Day    Packs/day: 0.50    Types: Cigarettes   Smokeless tobacco: Never   Tobacco comments:    10 cigarettes daily  Substance and Sexual Activity   Alcohol use: Yes    Alcohol/week: 1.0 standard drink    Types: 1 Glasses of wine per week    Comment: daily 1-2 glass wine/day   Drug use: Yes    Frequency: 7.0 times per week    Types: Marijuana   Sexual activity: Yes    Birth control/protection: None  Other Topics Concern   Not on file  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      Social Determinants of Health   Financial Resource Strain: Not on file  Food Insecurity: Not on file  Transportation Needs: Not on file  Physical Activity: Not on file  Stress: Not on file  Social Connections: Not on file  Intimate Partner Violence: Not on file    Outpatient Medications Prior to Visit  Medication Sig Dispense Refill   betamethasone dipropionate 0.05 % cream Apply twice daily as needed 30 g 1    CALCIUM-VITAMIN D PO Take 1 capsule by mouth daily.     cetirizine (ZYRTEC) 10 MG tablet Take 1 tablet (10 mg total) by mouth daily. 30 tablet 11   citalopram (CELEXA) 20 MG tablet TAKE 1 AND 1/2 TABLETS DAILY BY MOUTH 135 tablet 1   diclofenac sodium (VOLTAREN) 1 % GEL Apply 2 g topically 4 (four) times daily. To affected joint. 100 g 11   EYSUVIS 0.25 % SUSP Place 1 drop into both eyes daily.     fluticasone (FLONASE) 50 MCG/ACT nasal spray USE 2 SPRAYS IN TO EACH NOSTRIL DAILY AS DIRECTED (Patient taking differently: Place 2 sprays into both nostrils daily as needed for allergies.) 48 g 5   gabapentin (NEURONTIN) 300 MG capsule TAKE 1 CAPSULE BY MOUTH EVERYDAY AT BEDTIME 90 capsule 1   varenicline (CHANTIX PAK) 0.5 MG X 11 & 1 MG X 42 tablet Take one 0.5 mg tablet by mouth once daily for 3 days, then increase to one 0.5 mg tablet twice daily for 4 days, then increase to one 1 mg tablet twice daily. 53 tablet 0   No facility-administered medications prior to visit.    No Known Allergies  Review of Systems  Constitutional:  Negative for fever.  HENT:  Negative for congestion, ear pain, sinus pain and sore throat.   Respiratory:  Negative for wheezing.   Cardiovascular:  Negative for palpitations.  Gastrointestinal:  Negative for blood in stool, constipation, diarrhea, nausea and vomiting.  Genitourinary:  Negative for dysuria, frequency and hematuria.  Musculoskeletal:  Negative for joint pain and myalgias.  Skin:        (-) New Moles  Neurological:  Negative for headaches.      Objective:    Physical Exam Constitutional:      General: She is not in acute distress.    Appearance: Normal appearance. She is not ill-appearing.  HENT:     Head: Normocephalic and atraumatic.     Right Ear: Tympanic membrane, ear canal and external ear normal.     Left Ear: Tympanic membrane, ear canal and external ear normal.  Eyes:     Extraocular Movements: Extraocular movements intact.      Pupils: Pupils are equal, round, and reactive to light.  Neck:     Thyroid: Thyromegaly (Mild) present.  Cardiovascular:     Rate and Rhythm: Normal rate and regular rhythm.  Pulses: Normal pulses.     Heart sounds: Normal heart sounds. No murmur heard.   No gallop.  Pulmonary:     Effort: No respiratory distress.     Breath sounds: Normal breath sounds. No wheezing or rales.  Abdominal:     General: Bowel sounds are normal. There is no distension.     Palpations: Abdomen is soft.     Tenderness: There is no abdominal tenderness. There is no guarding.  Musculoskeletal:     Cervical back: No tenderness.     Comments: 5/5 strength in both upper and lower extremities   Lymphadenopathy:     Cervical: No cervical adenopathy.  Neurological:     Mental Status: She is alert and oriented to [name], place, and time.     Deep Tendon Reflexes:     Reflex Scores:      Patellar reflexes are 3+ on the right side and 3+ on the left side. Psychiatric:        Judgment: Judgment normal.    BP 104/72 (BP Location: Right Arm, Patient Position: Sitting, Cuff Size: Small)    Pulse 60    Temp 98.1 F (36.7 C) (Oral)    Resp 16    Ht $R'5\' 4"'UZ$  (1.626 m)    Wt 121 lb (54.9 kg)    SpO2 99%    BMI 20.77 kg/m  Wt Readings from Last 3 Encounters:  08/17/21 121 lb (54.9 kg)  05/25/21 133 lb 9.6 oz (60.6 kg)  03/02/21 126 lb 9.6 oz (57.4 kg)       Assessment & Plan:   Problem List Items Addressed This Visit       Unprioritized   Tobacco abuse    Recommended that she restart chantix.       Preventative health care - Primary    Wt Readings from Last 3 Encounters:  08/17/21 121 lb (54.9 kg)  05/25/21 133 lb 9.6 oz (60.6 kg)  03/02/21 126 lb 9.6 oz (57.4 kg)   Mammo is scheduled. Colo up to date. Declines shingrix. Will consider bivalent booster. Pap up to date. Continue regular exercise. I don't think she is eating enough. She is often skipping breakfast. Recommended that she add a good breakfast  each day and avoid skipping meals.       Other Visit Diagnoses     Renal insufficiency       Relevant Orders   Comp Met (CMET)   Weight loss       Relevant Orders   TSH   Hyperglycemia       Relevant Orders   Hemoglobin A1c   Hyperlipidemia, unspecified hyperlipidemia type       Relevant Orders   Lipid panel         Meds ordered this encounter  Medications   varenicline (CHANTIX PAK) 0.5 MG X 11 & 1 MG X 42 tablet    Sig: Take one 0.5 mg tablet by mouth once daily for 3 days, then increase to one 0.5 mg tablet twice daily for 4 days, then increase to one 1 mg tablet twice daily.    Dispense:  53 tablet    Refill:  0    Order Specific Question:   Supervising Provider    Answer:   Penni Homans A [[id number]]    I, Nance Pear, NP, personally preformed the services described in this documentation.  All medical record entries made by the scribe were at my direction and in my presence.  I  have reviewed the chart and discharge instructions (if applicable) and agree that the record reflects my personal performance and is accurate and complete. 08/17/2021   I,Amber Collins,acting as a scribe for Nance Pear, NP.,have documented all relevant documentation on the behalf of Nance Pear, NP,as directed by  Nance Pear, NP while in the presence of Nance Pear, NP.    Nance Pear, NP

## 2021-08-17 NOTE — Assessment & Plan Note (Signed)
Recommended that she restart chantix.  ?

## 2021-08-17 NOTE — Assessment & Plan Note (Addendum)
Wt Readings from Last 3 Encounters:  ?08/17/21 121 lb (54.9 kg)  ?05/25/21 133 lb 9.6 oz (60.6 kg)  ?03/02/21 126 lb 9.6 oz (57.4 kg)  ? ? ?Mammo is scheduled. Colo up to date. Declines shingrix. Will consider bivalent booster. Pap up to date. Continue regular exercise. I don't think she is eating enough. She is often skipping breakfast. Recommended that she add a good breakfast each day and avoid skipping meals.  ?

## 2021-08-17 NOTE — Patient Instructions (Signed)
Please complete lab work prior to leaving.   

## 2021-08-17 NOTE — Assessment & Plan Note (Signed)
She may have fractured her toe, but advised her buddy taping is the best treatment. Continue same.  ?

## 2021-08-18 ENCOUNTER — Telehealth: Payer: Self-pay | Admitting: Family

## 2021-08-18 ENCOUNTER — Encounter: Payer: Self-pay | Admitting: Family

## 2021-08-18 DIAGNOSIS — R739 Hyperglycemia, unspecified: Secondary | ICD-10-CM | POA: Insufficient documentation

## 2021-08-18 NOTE — Telephone Encounter (Signed)
See mychart.  

## 2021-08-31 DIAGNOSIS — Z202 Contact with and (suspected) exposure to infections with a predominantly sexual mode of transmission: Secondary | ICD-10-CM | POA: Diagnosis not present

## 2021-08-31 DIAGNOSIS — Z01411 Encounter for gynecological examination (general) (routine) with abnormal findings: Secondary | ICD-10-CM | POA: Diagnosis not present

## 2021-08-31 DIAGNOSIS — Z780 Asymptomatic menopausal state: Secondary | ICD-10-CM | POA: Diagnosis not present

## 2021-08-31 DIAGNOSIS — N898 Other specified noninflammatory disorders of vagina: Secondary | ICD-10-CM | POA: Diagnosis not present

## 2021-08-31 DIAGNOSIS — Z113 Encounter for screening for infections with a predominantly sexual mode of transmission: Secondary | ICD-10-CM | POA: Diagnosis not present

## 2021-08-31 DIAGNOSIS — N951 Menopausal and female climacteric states: Secondary | ICD-10-CM | POA: Diagnosis not present

## 2021-08-31 DIAGNOSIS — Z130 Encounter for screening for diseases of the blood and blood-forming organs and certain disorders involving the immune mechanism: Secondary | ICD-10-CM | POA: Diagnosis not present

## 2021-08-31 DIAGNOSIS — Z1231 Encounter for screening mammogram for malignant neoplasm of breast: Secondary | ICD-10-CM | POA: Diagnosis not present

## 2021-08-31 DIAGNOSIS — A6004 Herpesviral vulvovaginitis: Secondary | ICD-10-CM | POA: Diagnosis not present

## 2021-09-04 IMAGING — US US THYROID
1 series · 13 of 25 positions shown · non-contrast
Comparison: None.

CLINICAL DATA: Thyromegaly on exam

EXAM:
THYROID ULTRASOUND
TECHNIQUE: Ultrasound examination of the thyroid gland and adjacent soft
tissues was performed.

[Series 1: us thyroid · 13 of 35 slices shown]
[im 1/35]
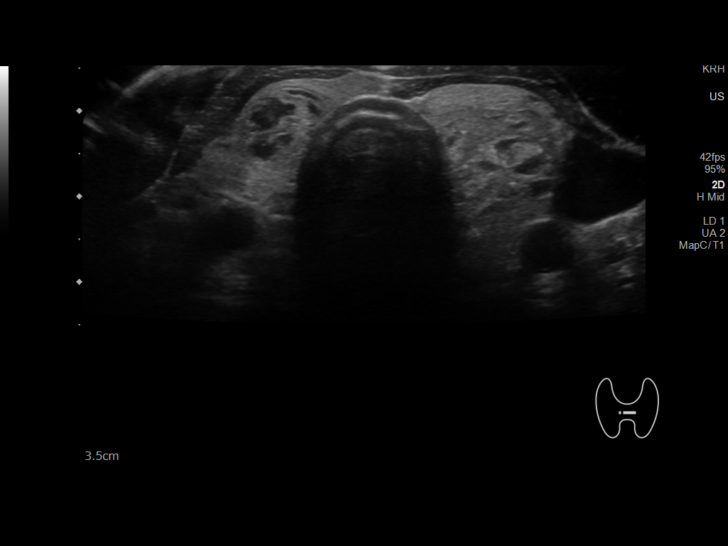
[im 3/35]
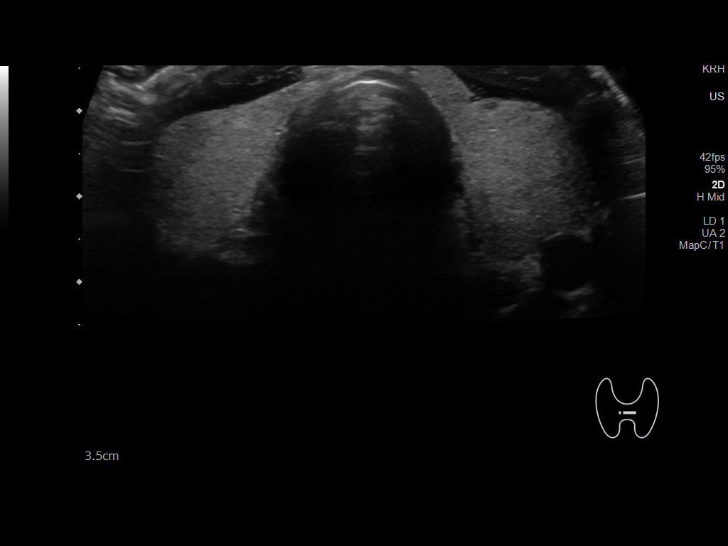
[im 6/35]
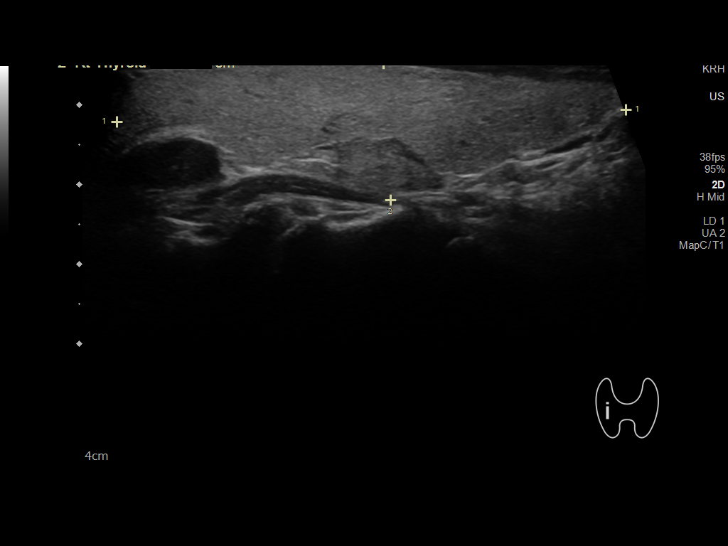
[im 9/35]
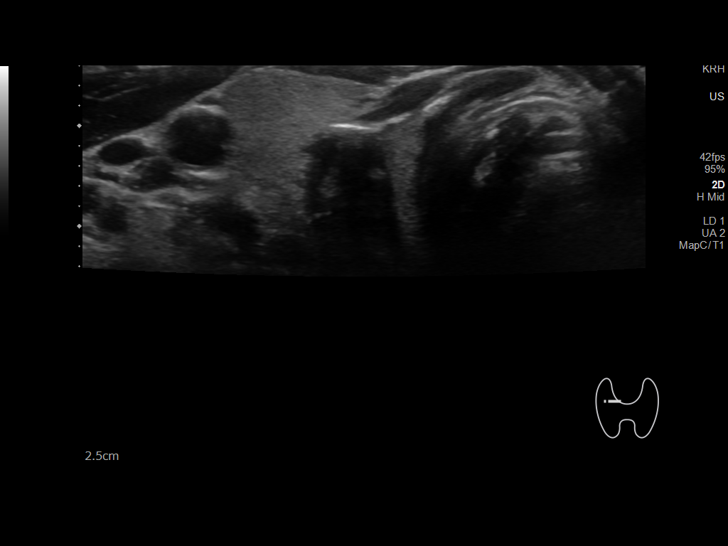
[im 12/35]
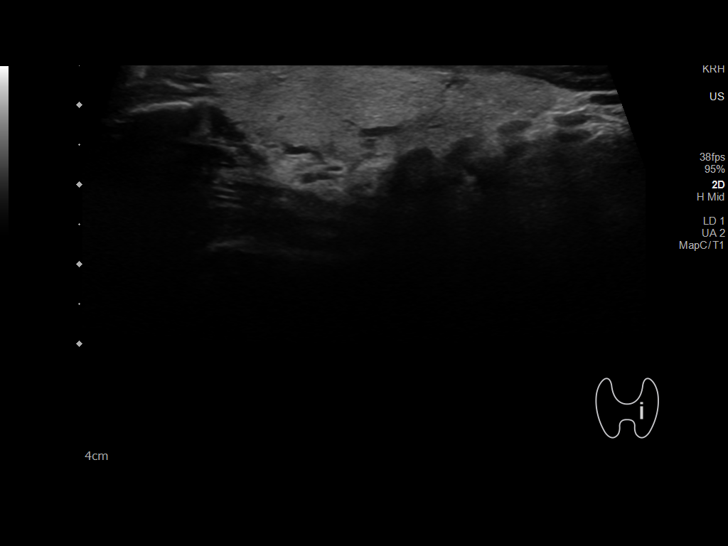
[im 15/35]
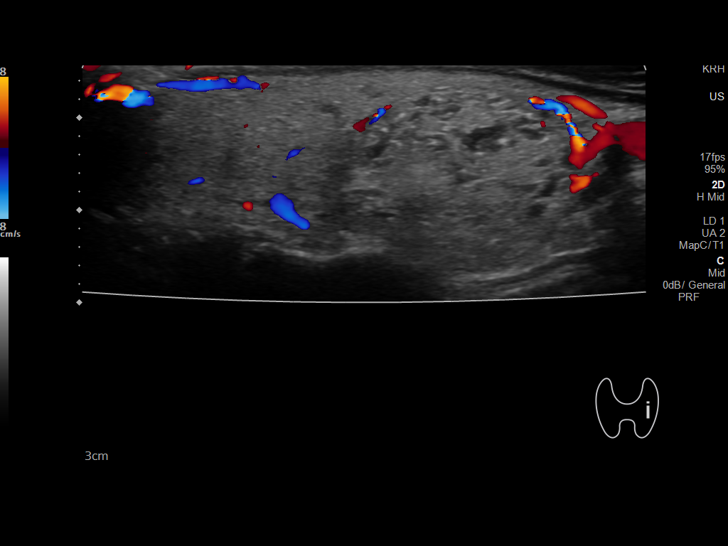
[im 18/35]
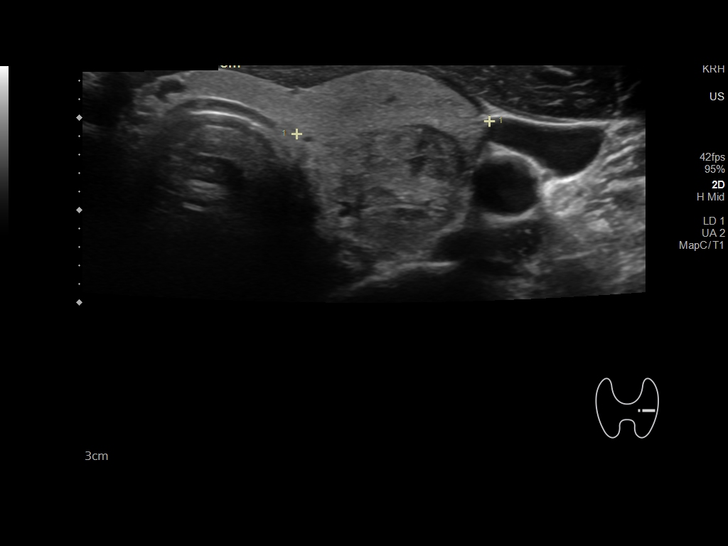
[im 20/35]
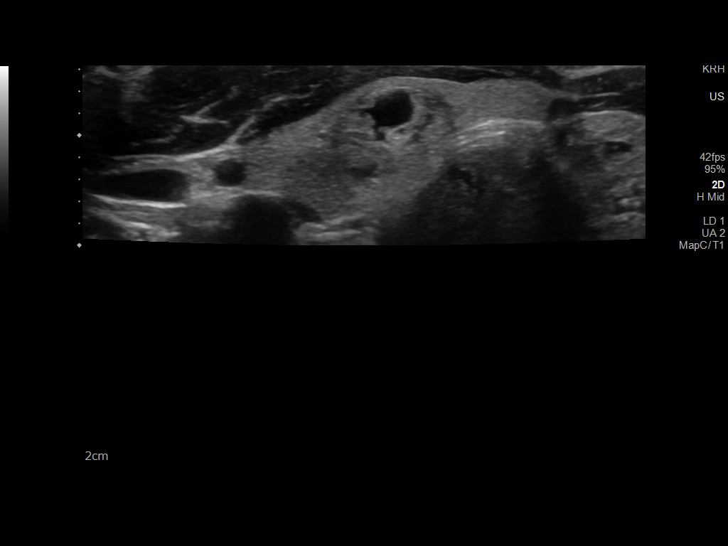
[im 23/35]
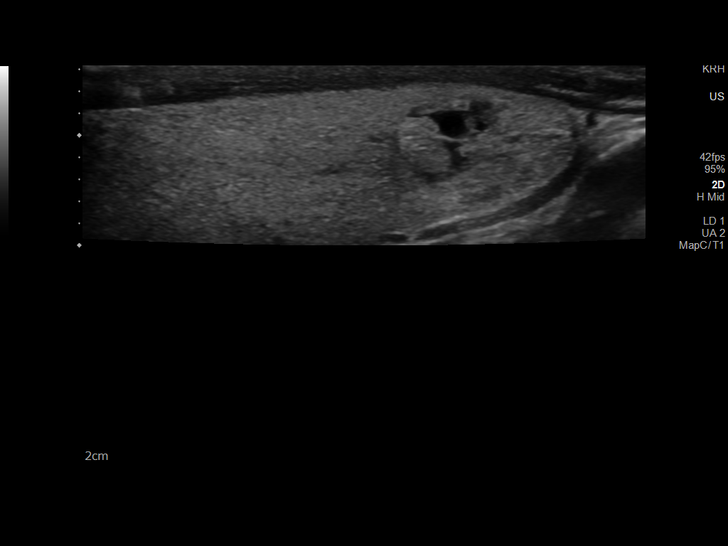
[im 26/35]
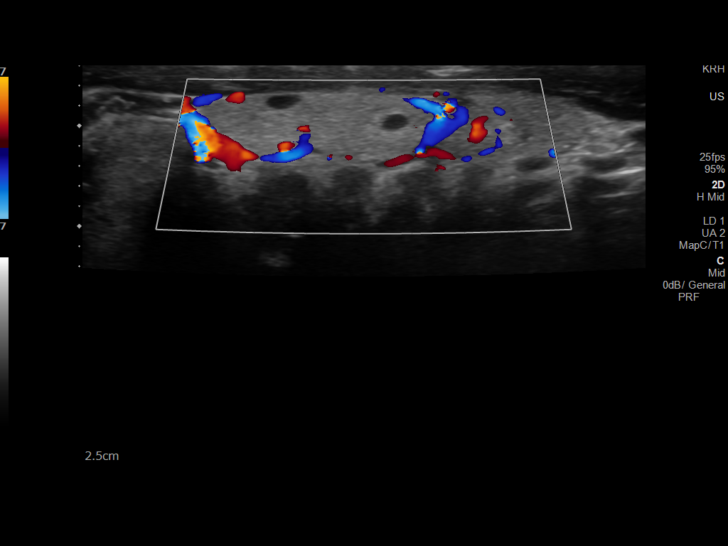
[im 29/35]
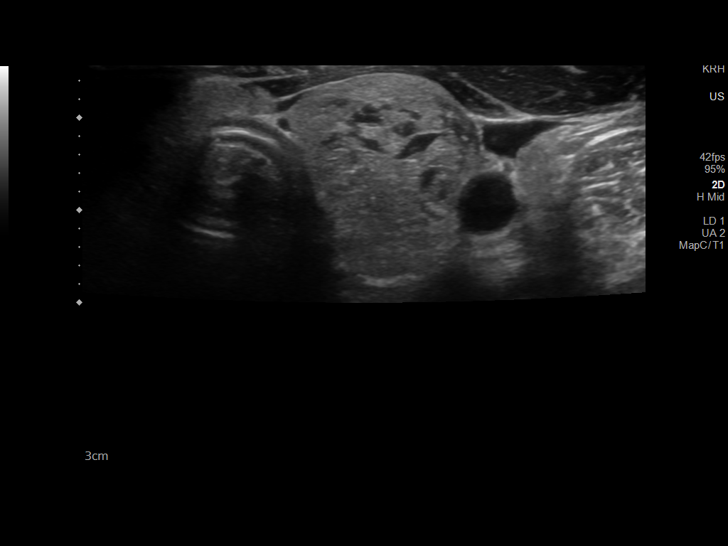
[im 32/35]
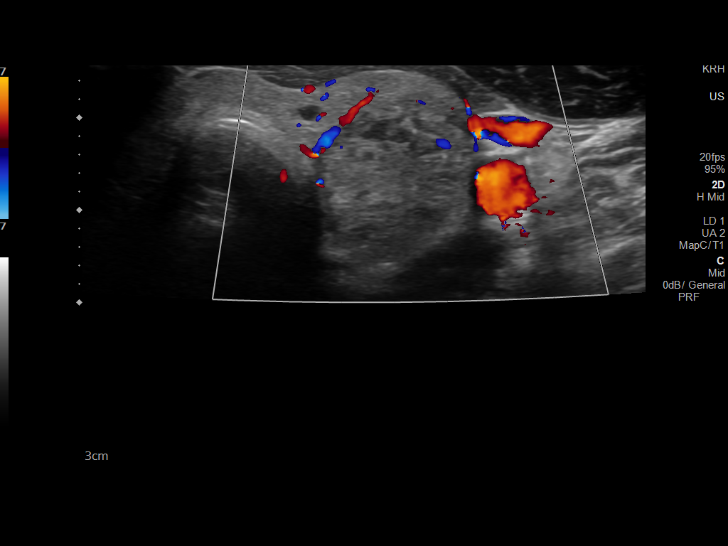
[im 35/35]
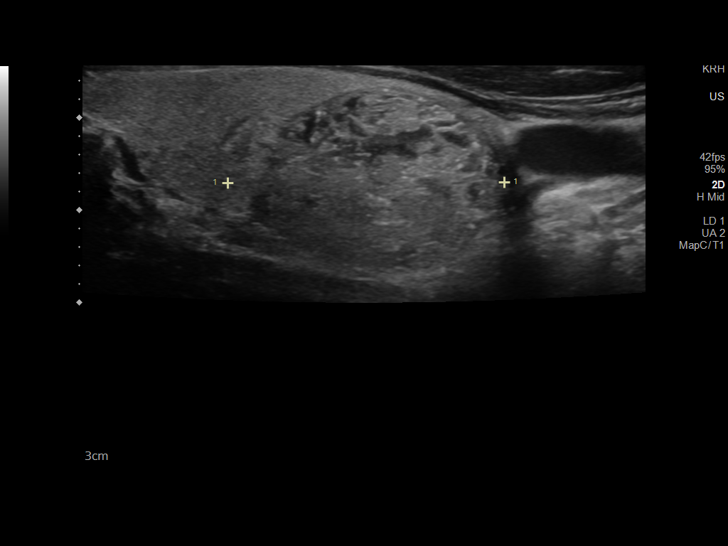

[13 of 25 positions shown; findings below may reference images not displayed]

FINDINGS: Parenchymal Echotexture: Mildly heterogenous

Isthmus: 3 mm

Right lobe: 6.4 x 1.7 x 2.0 cm

Left lobe: 6.2 x 2.1 x 2.1 cm

_________________________________________________________

Estimated total number of nodules >/= 1 cm: 2

Number of spongiform nodules >/=  2 cm not described below (TR1): 0

Number of mixed cystic and solid nodules >/= 1.5 cm not described
below (TR2): 0

_________________________________________________________

Nodule # 1:

Location: Right; Inferior

Maximum size: 1.6 cm; Other 2 dimensions: 0.8 x 1.1 cm

Composition: solid/almost completely solid (2)

Echogenicity: isoechoic (1)

Shape: not taller-than-wide (0)

Margins: ill-defined (0)

Echogenic foci: none (0)

ACR TI-RADS total points: 3.

ACR TI-RADS risk category: TR3 (3 points).

ACR TI-RADS recommendations:

*Given size (>/= 1.5 - 2.4 cm) and appearance, a follow-up
ultrasound in 1 year should be considered based on TI-RADS criteria.

_________________________________________________________

Nodule # 2:

Location: Left; Inferior

Maximum size: 3.0 cm; Other 2 dimensions: 1.6 x 1.7 cm

Composition: solid/almost completely solid (2)

Echogenicity: isoechoic (1)

Shape: not taller-than-wide (0)

Margins: ill-defined (0)

Echogenic foci: none (0)

ACR TI-RADS total points: 3.

ACR TI-RADS risk category: TR3 (3 points).

ACR TI-RADS recommendations:

**Given size (>/= 2.5 cm) and appearance, fine needle aspiration of
this mildly suspicious nodule should be considered based on TI-RADS
criteria.

_________________________________________________________

There are a few additional benign scattered subcentimeter cystic
nodules noted. No hypervascularity. No regional adenopathy.
IMPRESSION: 3.0 cm left inferior TR 3 nodule meets criteria for biopsy as above.

1.6 cm right inferior TR 3 nodule meets criteria follow-up in 1
year.

The above is in keeping with the ACR TI-RADS recommendations - [HOSPITAL] 3062;[DATE].

## 2021-09-07 ENCOUNTER — Ambulatory Visit: Payer: Federal, State, Local not specified - PPO | Admitting: Family

## 2021-09-07 VITALS — BP 100/66 | HR 80 | Temp 98.5°F | Resp 16 | Wt 122.4 lb

## 2021-09-07 DIAGNOSIS — J069 Acute upper respiratory infection, unspecified: Secondary | ICD-10-CM

## 2021-09-07 DIAGNOSIS — H1033 Unspecified acute conjunctivitis, bilateral: Secondary | ICD-10-CM

## 2021-09-07 LAB — POCT RAPID STREP A (OFFICE): Rapid Strep A Screen: NEGATIVE

## 2021-09-07 MED ORDER — POLYMYXIN B-TRIMETHOPRIM 10000-0.1 UNIT/ML-% OP SOLN: 2.0000 [drp] | Freq: Four times a day (QID) | OPHTHALMIC | 0 refills | Status: AC

## 2021-09-07 MED ORDER — BENZONATATE 100 MG PO CAPS: 100.0000 mg | ORAL_CAPSULE | Freq: Three times a day (TID) | ORAL | 0 refills | Status: DC | PRN

## 2021-09-07 NOTE — Progress Notes (Signed)
? ?Subjective:  ? ?By signing my name below, I, Christie [name], attest that this documentation has been prepared under the direction and in the presence of [name], [name] NP, 09/07/2021 ? ? Patient ID: Christie [name], female    DOB: [birth year], 56 y.o.   MRN: [id number] ? ?Chief Complaint  ?Patient presents with  ? Sore Throat  ?  Complains of sore throat, neg covid test at home today  ? Otalgia  ?  Complains of pain on both ears  ? Eye Drainage  ?  Reports eye drainage with irritation and tenderness  ? ? ?HPI ?Patient is in today for an office visit. ? ?Sinus Symptoms - She complains of a sore throat that appeared on 09/02/2021. She took OTC antibiotics that day and stated that it helped relieved her symptoms. Between 09/03/2021 - 09/05/2021, her symptoms worsened. She developed ear pain, a fever and reddening of the eyes. On 09/06/2021, her head began to hurt and she developed a cough. On 09/07/2021, she noticed her mucus appeared yellow. She also states that she has had nasal drainage for the last few days. ? ? ?Health Maintenance Due  ?Topic Date Due  ? COVID-19 Vaccine (4 - Booster for Pfizer series) 05/23/2020  ? ? ?Past Medical History:  ?Diagnosis Date  ? Anxiety   ? Hyperglycemia   ? ? ?Past Surgical History:  ?Procedure Laterality Date  ? ABLATION  2010  ? uterine  ? HYSTEROSCOPY  2010   ? per patient  ? ? ?Family History  ?Problem Relation Age of Onset  ? Hypertension Mother   ? Heart failure Mother   ? Lung cancer Mother   ?     s/p resection  ? Diabetes Maternal Grandmother   ?     type II  ? Hypertension Maternal Grandmother   ? Alcohol abuse Other   ?     family hx  ? Anxiety disorder Other   ?     family hx  ? Arthritis Other   ?     family hx  ? Hypertension Other   ?     family hx  ? Thyroid disease Other   ?     family hx  ? Gallstones Brother   ? Hypertension Brother   ? ? ?Social History  ? ?Socioeconomic History  ? Marital status: Married  ?  Spouse name: Not on file  ? Number of  children: Not on file  ? Years of education: Not on file  ? Highest education level: Not on file  ?Occupational History  ? Not on file  ?Tobacco Use  ? Smoking status: Every Day  ?  Packs/day: 0.50  ?  Types: Cigarettes  ? Smokeless tobacco: Never  ? Tobacco comments:  ?  10 cigarettes daily  ?Substance and Sexual Activity  ? Alcohol use: Yes  ?  Alcohol/week: 1.0 standard drink  ?  Types: 1 Glasses of wine per week  ?  Comment: daily 1-2 glass wine/day  ? Drug use: Yes  ?  Frequency: 7.0 times per week  ?  Types: Marijuana  ? Sexual activity: Yes  ?  Birth control/protection: None  ?Other Topics Concern  ? 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 One son, adult, lives locally  ? 2 grandchildren  ? Enjoys spending time with friends, dance, wineries.  ?   ? ?Social Determinants of Health  ? ?Financial Resource Strain: Not on  file  ?Food Insecurity: Not on file  ?Transportation Needs: Not on file  ?Physical Activity: Not on file  ?Stress: Not on file  ?Social Connections: Not on file  ?Intimate Partner Violence: Not on file  ? ? ?Outpatient Medications Prior to Visit  ?Medication Sig Dispense Refill  ? betamethasone dipropionate 0.05 % cream Apply twice daily as needed 30 g 1  ? CALCIUM-VITAMIN D PO Take 1 capsule by mouth daily.    ? cetirizine (ZYRTEC) 10 MG tablet Take 1 tablet (10 mg total) by mouth daily. 30 tablet 11  ? citalopram (CELEXA) 20 MG tablet TAKE 1 AND 1/2 TABLETS DAILY BY MOUTH 135 tablet 1  ? diclofenac sodium (VOLTAREN) 1 % GEL Apply 2 g topically 4 (four) times daily. To affected joint. 100 g 11  ? EYSUVIS 0.25 % SUSP Place 1 drop into both eyes daily.    ? fluticasone (FLONASE) 50 MCG/ACT nasal spray USE 2 SPRAYS IN TO EACH NOSTRIL DAILY AS DIRECTED (Patient taking differently: Place 2 sprays into both nostrils daily as needed for allergies.) 48 g 5  ? gabapentin (NEURONTIN) 300 MG capsule TAKE 1 CAPSULE BY MOUTH EVERYDAY AT BEDTIME 90 capsule 1  ? varenicline  (CHANTIX PAK) 0.5 MG X 11 & 1 MG X 42 tablet Take one 0.5 mg tablet by mouth once daily for 3 days, then increase to one 0.5 mg tablet twice daily for 4 days, then increase to one 1 mg tablet twice daily. 53 tablet 0  ? ?No facility-administered medications prior to visit.  ? ? ?No Known Allergies ? ?Review of Systems  ?Constitutional:  Positive for fever.  ?HENT:  Positive for ear pain and sore throat.   ?     (+) Nasal Drainage  ?Eyes:  Positive for redness.  ?Respiratory:  Positive for cough and sputum production.   ?Neurological:  Positive for headaches.  ? ?   ?Objective:  ?  ?Physical Exam ?Constitutional:   ?   General: She is not in acute distress. ?   Appearance: Normal appearance. She is not ill-appearing.  ?HENT:  ?   Head: Normocephalic and atraumatic.  ?   Right Ear: Tympanic membrane, ear canal and external ear normal.  ?   Left Ear: Tympanic membrane, ear canal and external ear normal.  ?   Mouth/Throat:  ?   Tonsils: No tonsillar exudate (minimal erythema noted) or tonsillar abscesses. 1+ on the right. 1+ on the left.  ?Eyes:  ?   Extraocular Movements: Extraocular movements intact.  ?   Conjunctiva/sclera:  ?   Right eye: No exudate. ?   Left eye: No exudate. ?   Pupils: Pupils are equal, round, and reactive to light.  ?   Comments: Eyes appear watery, left lid is mildly puffy ?Mild injection bilaterally  ?Neck:  ?   Thyroid: No thyromegaly.  ?Cardiovascular:  ?   Rate and Rhythm: Normal rate and regular rhythm.  ?   Heart sounds: Normal heart sounds. No murmur heard. ?  No gallop.  ?Pulmonary:  ?   Effort: Pulmonary effort is normal. No respiratory distress.  ?   Breath sounds: Normal breath sounds. No wheezing or rales.  ?Lymphadenopathy:  ?   Cervical: Cervical adenopathy present.  ?Skin: ?   General: Skin is warm and dry.  ?Neurological:  ?   Mental Status: She is alert and oriented to person, place, and time.  ?Psychiatric:     ?   Mood and Affect: Mood normal.     ?  Behavior: Behavior  normal.     ?   Judgment: Judgment normal.  ? ? ?BP 100/66 (BP Location: Right Arm, Patient Position: Sitting, Cuff Size: Small)   Pulse 80   Temp 98.5 ?F (36.9 ?C) (Oral)   Resp 16   Wt 122 lb 6.4 oz (55.5 kg)   SpO2 99%   BMI 21.01 kg/m?  ?Wt Readings from Last 3 Encounters:  ?09/07/21 122 lb 6.4 oz (55.5 kg)  ?08/17/21 121 lb (54.9 kg)  ?05/25/21 133 lb 9.6 oz (60.6 kg)  ? ? ?   ?Assessment & Plan:  ? ?Problem List Items Addressed This Visit   ? ?  ? Unprioritized  ? Viral URI with cough  ?  New. Rapid strep is negative. Discussed supportive measures including tylenol prn. Tessalon prn cough, fluids, rest. Call if new/worsening symptoms or if symptoms are not improved in 3-4 days.  ?  ?  ? Relevant Orders  ? POCT rapid strep A  ? Acute conjunctivitis of both eyes - Primary  ?  New. Rx provided for polytrip drops. ?  ?  ? ? ? ? ?Meds ordered this encounter  ?Medications  ? trimethoprim-polymyxin b (POLYTRIM) ophthalmic solution  ?  Sig: Place 2 drops into both eyes every 6 (six) hours for 7 days.  ?  Dispense:  10 mL  ?  Refill:  0  ?  Order Specific Question:   Supervising Provider  ?  Answer:   Danise Edge A [[id number]]  ? benzonatate (TESSALON) 100 MG capsule  ?  Sig: Take 1 capsule (100 mg total) by mouth 3 (three) times daily as needed.  ?  Dispense:  20 capsule  ?  Refill:  0  ?  Order Specific Question:   Supervising Provider  ?  Answer:   Danise Edge A [[id number]]  ? ? ?I, Lemont Fillers, NP, personally preformed the services described in this documentation.  All medical record entries made by the scribe were at my direction and in my presence.  I have reviewed the chart and discharge instructions (if applicable) and agree that the record reflects my personal performance and is accurate and complete. 09/07/2021 ? ? ?I,Amber [name],acting as a Neurosurgeon for [employer], NP.,have documented all relevant documentation on the behalf of Lemont Fillers, NP,as directed by  Lemont Fillers,  NP while in the presence of Lemont Fillers, NP. ? ? ? ?Lemont Fillers, NP ? ?

## 2021-09-07 NOTE — Patient Instructions (Addendum)
You may use tylenol as needed for pain. ?Drink plenty of fluids and get plenty of rest.  ?Apply eye drops to both eyes every 4 hours while awake.  ?You may use tessalon as needed for cough.  ?Call if new/worsening symptoms or if symptoms are not improved in 3 days.  ?

## 2021-09-07 NOTE — Assessment & Plan Note (Signed)
New. Rapid strep is negative. Discussed supportive measures including tylenol prn. Tessalon prn cough, fluids, rest. Call if new/worsening symptoms or if symptoms are not improved in 3-4 days.  ?

## 2021-09-07 NOTE — Assessment & Plan Note (Signed)
New. Rx provided for polytrip drops. ?

## 2021-09-11 DIAGNOSIS — H10013 Acute follicular conjunctivitis, bilateral: Secondary | ICD-10-CM | POA: Diagnosis not present

## 2021-09-16 DIAGNOSIS — H10023 Other mucopurulent conjunctivitis, bilateral: Secondary | ICD-10-CM | POA: Diagnosis not present

## 2021-10-09 DIAGNOSIS — H00024 Hordeolum internum left upper eyelid: Secondary | ICD-10-CM | POA: Diagnosis not present

## 2021-10-13 DIAGNOSIS — H00024 Hordeolum internum left upper eyelid: Secondary | ICD-10-CM | POA: Diagnosis not present

## 2021-11-30 DIAGNOSIS — H04123 Dry eye syndrome of bilateral lacrimal glands: Secondary | ICD-10-CM | POA: Diagnosis not present

## 2022-02-25 ENCOUNTER — Ambulatory Visit: Payer: Federal, State, Local not specified - PPO | Admitting: Family

## 2022-03-02 ENCOUNTER — Ambulatory Visit: Payer: Federal, State, Local not specified - PPO | Admitting: Family

## 2022-03-15 ENCOUNTER — Ambulatory Visit: Payer: Federal, State, Local not specified - PPO | Admitting: Family

## 2022-03-18 ENCOUNTER — Ambulatory Visit: Payer: Federal, State, Local not specified - PPO | Admitting: Family

## 2022-03-18 VITALS — BP 98/70 | HR 57 | Temp 97.8°F | Resp 18 | Ht 64.0 in | Wt 129.2 lb

## 2022-03-18 DIAGNOSIS — F411 Generalized anxiety disorder: Secondary | ICD-10-CM | POA: Diagnosis not present

## 2022-03-18 DIAGNOSIS — R739 Hyperglycemia, unspecified: Secondary | ICD-10-CM | POA: Diagnosis not present

## 2022-03-18 DIAGNOSIS — R232 Flushing: Secondary | ICD-10-CM

## 2022-03-18 DIAGNOSIS — H16143 Punctate keratitis, bilateral: Secondary | ICD-10-CM | POA: Diagnosis not present

## 2022-03-18 DIAGNOSIS — E042 Nontoxic multinodular goiter: Secondary | ICD-10-CM | POA: Diagnosis not present

## 2022-03-18 DIAGNOSIS — Z720 Tobacco use: Secondary | ICD-10-CM

## 2022-03-18 DIAGNOSIS — Z23 Encounter for immunization: Secondary | ICD-10-CM | POA: Diagnosis not present

## 2022-03-18 HISTORY — DX: Nontoxic multinodular goiter: E04.2

## 2022-03-18 LAB — COMPREHENSIVE METABOLIC PANEL
ALT: 15 U/L (ref 0–35)
AST: 20 U/L (ref 0–37)
Albumin: 4.5 g/dL (ref 3.5–5.2)
Alkaline Phosphatase: 65 U/L (ref 39–117)
BUN: 21 mg/dL (ref 6–23)
CO2: 25 mEq/L (ref 19–32)
Calcium: 10 mg/dL (ref 8.4–10.5)
Chloride: 105 mEq/L (ref 96–112)
Creatinine, Ser: 1.06 mg/dL (ref 0.40–1.20)
GFR: 58.61 mL/min — ABNORMAL LOW (ref >60.00)
Glucose, Bld: 83 mg/dL (ref 70–99)
Potassium: 4.1 mEq/L (ref 3.5–5.1)
Sodium: 140 mEq/L (ref 135–145)
Total Bilirubin: 0.4 mg/dL (ref 0.2–1.2)
Total Protein: 6.8 g/dL (ref 6.0–8.3)

## 2022-03-18 LAB — TSH: TSH: 1.16 u[IU]/mL (ref 0.35–5.50)

## 2022-03-18 LAB — HEMOGLOBIN A1C: Hgb A1c MFr Bld: 6.2 % (ref 4.6–6.5)

## 2022-03-18 MED ORDER — CITALOPRAM HYDROBROMIDE 20 MG PO TABS: ORAL_TABLET | ORAL | 1 refills | Status: DC

## 2022-03-18 MED ORDER — GABAPENTIN 300 MG PO CAPS: ORAL_CAPSULE | ORAL | 1 refills | Status: DC

## 2022-03-18 NOTE — Assessment & Plan Note (Signed)
Hot flashes are worse when she drinks wine.  Continue gabapentin and citalopram.  She is not interested in HRT.

## 2022-03-18 NOTE — Assessment & Plan Note (Signed)
Never picked up chantix starter pack.  Encouraged her to reach out to her pharmacy to fill rx on file.

## 2022-03-18 NOTE — Assessment & Plan Note (Signed)
Had stable [place] last year. Obtain follow up TSH.

## 2022-03-18 NOTE — Assessment & Plan Note (Signed)
Stable on citalopram. Continue same.  

## 2022-03-18 NOTE — Assessment & Plan Note (Signed)
She has been working on diet. Obtain follow up A1C.

## 2022-03-18 NOTE — Progress Notes (Signed)
Subjective:   By signing my name below, I, Carylon Perches, attest that this documentation has been prepared under the direction and in the presence of Karie Chimera, NP 03/18/2022    Patient ID: Christie [name], female    DOB: 08-Mar-1966, 56 y.o.   MRN: [id number]  Chief Complaint  Patient presents with  . Follow-up    HPI Patient is in today for an office visit.  Refills: She is refilling 20 mg citalopram and 300 mg gabapentin   Thyroid: Her thyroid levels are stable this visit. Lab Results  Component Value Date   TSH 1.40 08/17/2021   Mood: Patients mood is stable this visit.  Medication: She is experiencing less arm pain with a gabapentin and hot flashes are still present with it.  Immunization: She is interested in receiving an influenza vaccine this visit. She is UTD on tetanus and Covid-19 vaccine except for latest one. She is not interested in receiving a shingles shot.  Health Maintenance Due  Topic Date Due  . COVID-19 Vaccine (4 - Pfizer series) 05/23/2020    Past Medical History:  Diagnosis Date  . Anxiety   . Hyperglycemia     Past Surgical History:  Procedure Laterality Date  . ABLATION  2010   uterine  . HYSTEROSCOPY  2010    per patient    Family History  Problem Relation Age of Onset  . Hypertension Mother   . Heart failure Mother   . Lung cancer Mother        s/p resection  . Diabetes Maternal Grandmother        type II  . Hypertension Maternal Grandmother   . Alcohol abuse Other        family hx  . Anxiety disorder Other        family hx  . Arthritis Other        family hx  . Hypertension Other        family hx  . Thyroid disease Other        family hx  . Gallstones Brother   . Hypertension Brother     Social History   Socioeconomic History  . Marital status: Married    Spouse name: Not on file  . Number of children: Not on file  . Years of education: Not on file  . Highest education level: Not on file   Occupational History  . Not on file  Tobacco Use  . Smoking status: Every Day    Packs/day: 0.50    Types: Cigarettes  . Smokeless tobacco: Never  . Tobacco comments:    10 cigarettes daily  Substance and Sexual Activity  . Alcohol use: Yes    Alcohol/week: 1.0 standard drink of alcohol    Types: 1 Glasses of wine per week    Comment: daily 1-2 glass wine/day  . Drug use: Yes    Frequency: 7.0 times per week    Types: Marijuana  . Sexual activity: Yes    Birth control/protection: None  Other Topics Concern  . 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      Social Determinants of Health   Financial Resource Strain: Not on file  Food Insecurity: Not on file  Transportation Needs: Not on file  Physical Activity: Not on file  Stress: Not on file  Social Connections: Not on file  Intimate Partner Violence: Not on file    Outpatient Medications Prior to Visit  Medication Sig Dispense Refill  . betamethasone dipropionate 0.05 % cream Apply twice daily as needed 30 g 1  . CALCIUM-VITAMIN D PO Take 1 capsule by mouth daily.    . cetirizine (ZYRTEC) 10 MG tablet Take 1 tablet (10 mg total) by mouth daily. 30 tablet 11  . diclofenac sodium (VOLTAREN) 1 % GEL Apply 2 g topically 4 (four) times daily. To affected joint. 100 g 11  . EYSUVIS 0.25 % SUSP Place 1 drop into both eyes daily.    . fluticasone (FLONASE) 50 MCG/ACT nasal spray USE 2 SPRAYS IN TO EACH NOSTRIL DAILY AS DIRECTED (Patient taking differently: Place 2 sprays into both nostrils daily as needed for allergies.) 48 g 5  . varenicline (CHANTIX PAK) 0.5 MG X 11 & 1 MG X 42 tablet Take one 0.5 mg tablet by mouth once daily for 3 days, then increase to one 0.5 mg tablet twice daily for 4 days, then increase to one 1 mg tablet twice daily. 53 tablet 0  . benzonatate (TESSALON) 100 MG capsule  Take 1 capsule (100 mg total) by mouth 3 (three) times daily as needed. 20 capsule 0  . citalopram (CELEXA) 20 MG tablet TAKE 1 AND 1/2 TABLETS DAILY BY MOUTH 135 tablet 1  . gabapentin (NEURONTIN) 300 MG capsule TAKE 1 CAPSULE BY MOUTH EVERYDAY AT BEDTIME 90 capsule 1   No facility-administered medications prior to visit.    No Known Allergies  ROS     Objective:    Physical Exam Constitutional:      General: She is not in acute distress.    Appearance: Normal appearance. She is not ill-appearing.  HENT:     Head: Normocephalic and atraumatic.     Right Ear: External ear normal.     Left Ear: External ear normal.  Eyes:     Extraocular Movements: Extraocular movements intact.     Pupils: Pupils are equal, round, and reactive to light.  Cardiovascular:     Rate and Rhythm: Normal rate and regular rhythm.     Heart sounds: Normal heart sounds. No murmur heard.    No gallop.  Pulmonary:     Effort: Pulmonary effort is normal. No respiratory distress.     Breath sounds: Normal breath sounds. No wheezing or rales.  Skin:    General: Skin is warm and dry.  Neurological:     Mental Status: She is alert and oriented to person, place, and time.  Psychiatric:        Mood and Affect: Mood normal.        Behavior: Behavior normal.        Judgment: Judgment normal.    BP 98/70 (BP Location: Left Arm, Patient Position: Sitting, Cuff Size: Normal)   Pulse (!) 57   Temp 97.8 F (36.6 C) (Oral)   Resp 18   Ht _0  (1.626 m)   Wt 129 lb 3.2 oz (58.6 kg)   SpO2 99%   BMI 22.18 kg/m  Wt Readings from Last 3 Encounters:  03/18/22 129 lb 3.2 oz (58.6 kg)  09/07/21 122 lb 6.4 oz (55.5 kg)  08/17/21 121 lb (54.9 kg)       Assessment & Plan:   Problem List Items Addressed This Visit       Unprioritized   Tobacco abuse    Never picked up chantix starter  pack.  Encouraged her to reach out to her pharmacy to fill rx on file.       Multinodular goiter - Primary    Had  stable [place] last year. Obtain follow up TSH.       Relevant Orders   TSH   Hyperglycemia    She has been working on diet. Obtain follow up A1C.       Relevant Orders   Hemoglobin A1c   Comp Met (CMET)   Hot flashes    Hot flashes are worse when she drinks wine.  Continue gabapentin and citalopram.  She is not interested in HRT.       Anxiety state    Stable on citalopram.  Continue same.       Relevant Medications   citalopram (CELEXA) 20 MG tablet   Other Visit Diagnoses     Need for influenza vaccination       Relevant Orders   Flu Vaccine QUAD 52moIM (Fluarix, Fluzone & Alfiuria Quad PF) (Completed)       Meds ordered this encounter  Medications  . citalopram (CELEXA) 20 MG tablet    Sig: TAKE 1 AND 1/2 TABLETS DAILY BY MOUTH    Dispense:  135 tablet    Refill:  1  . gabapentin (NEURONTIN) 300 MG capsule    Sig: TAKE 1 CAPSULE BY MOUTH EVERYDAY AT BEDTIME    Dispense:  90 capsule    Refill:  1    I, MNance Pear NP, personally preformed the services described in this documentation.  All medical record entries made by the scribe were at my direction and in my presence.  I have reviewed the chart and discharge instructions (if applicable) and agree that the record reflects my personal performance and is accurate and complete. 03/18/2022   I,Amber Collins,acting as a scribe for MNance Pear NP.,have documented all relevant documentation on the behalf of MNance Pear NP,as directed by  MNance Pear NP while in the presence of MNance Pear NP.      MNance Pear NP

## 2022-03-22 ENCOUNTER — Other Ambulatory Visit: Payer: Self-pay | Admitting: Family

## 2022-05-03 DIAGNOSIS — H10013 Acute follicular conjunctivitis, bilateral: Secondary | ICD-10-CM | POA: Diagnosis not present

## 2022-06-11 IMAGING — CT CT NECK W/ CM
4 of 6 series · 14 of 33 positions shown, 16 images · IV contrast (Omni 300)
Comparison: None.

CLINICAL DATA: Sore throat

EXAM:
CT NECK WITH CONTRAST
TECHNIQUE: Multidetector CT imaging of the neck was performed using the
standard protocol following the bolus administration of intravenous
contrast.
CONTRAST:  75mL OMNIPAQUE IOHEXOL 300 MG/ML  SOLN

[Series 3: neck 2.0 st · axial · 0.44mm/px · z∈[+1090,+1208]mm · 3 of 119 slices shown, 4 images]
[im 30/119  soft-tissue]
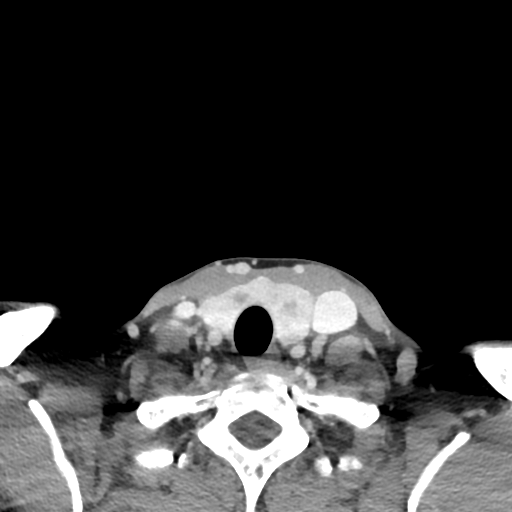
[im 30/119  bone]
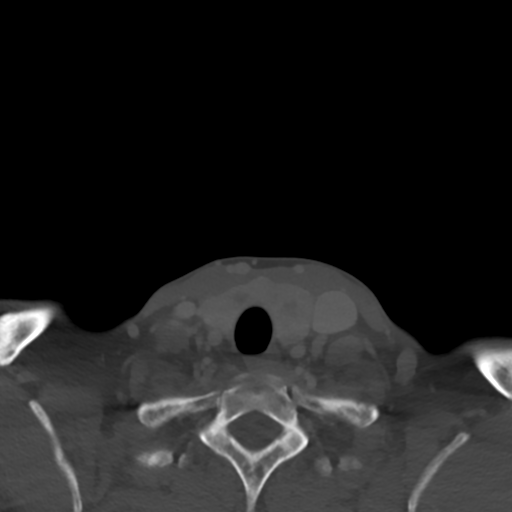
[im 60/119  bone]
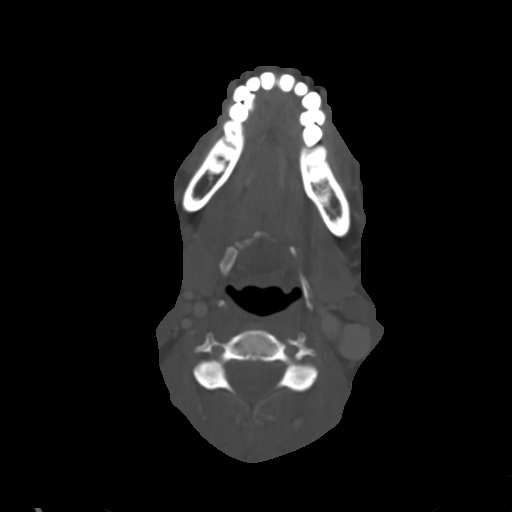
[im 89/119  bone]
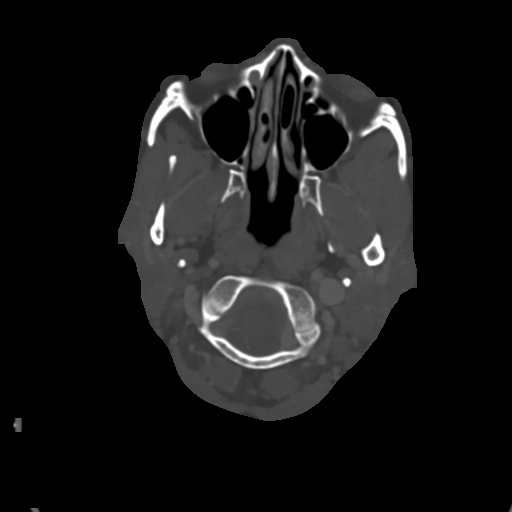

[Series 5: sagittal · sagittal · 0.46mm/px · 5 of 101 slices shown, 6 images]
[im 34/101  bone]
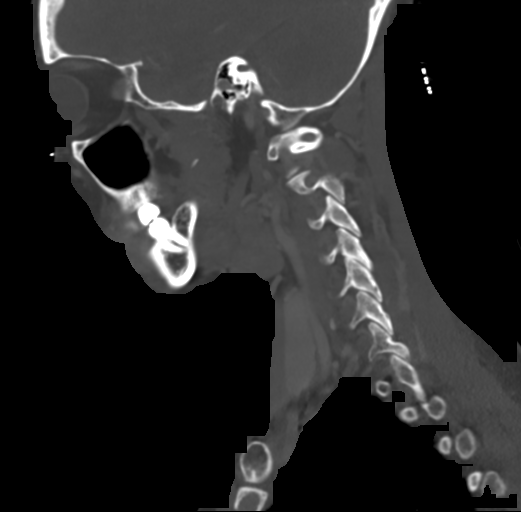
[im 42/101  bone]
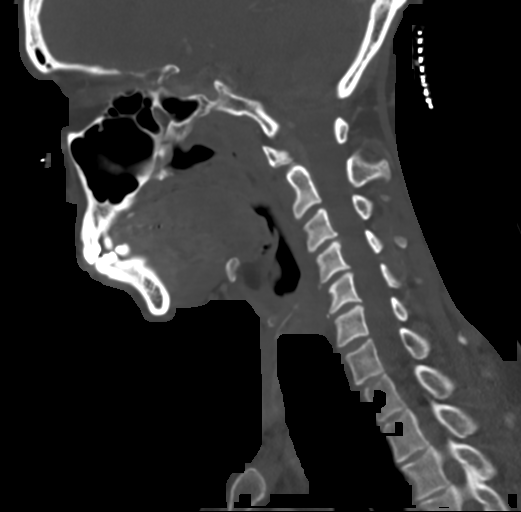
[im 51/101  soft-tissue]
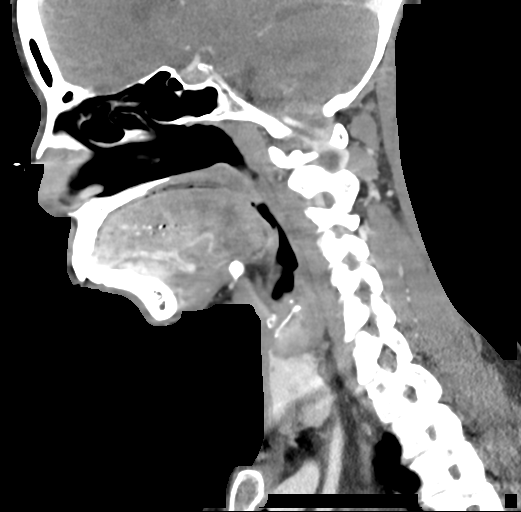
[im 51/101  bone]
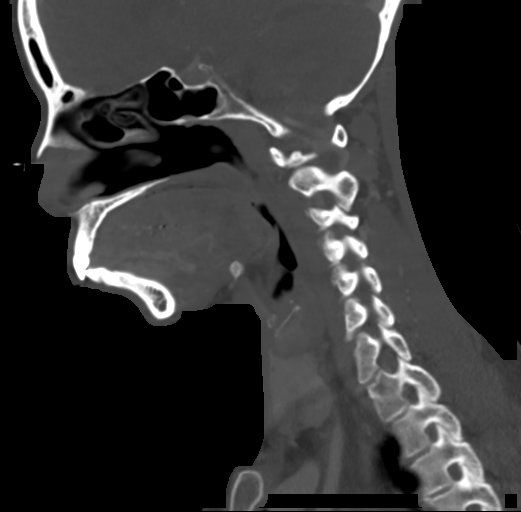
[im 59/101  bone]
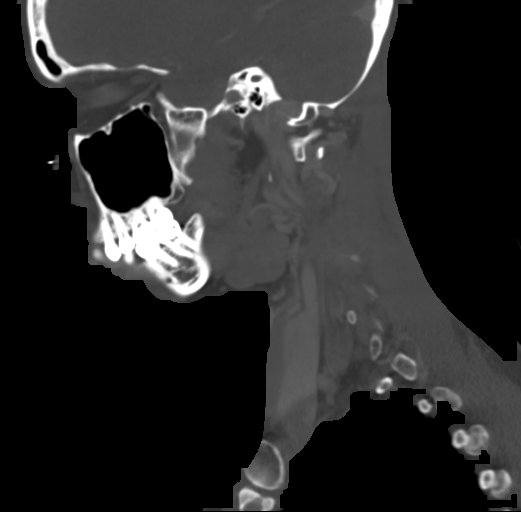
[im 67/101  bone]
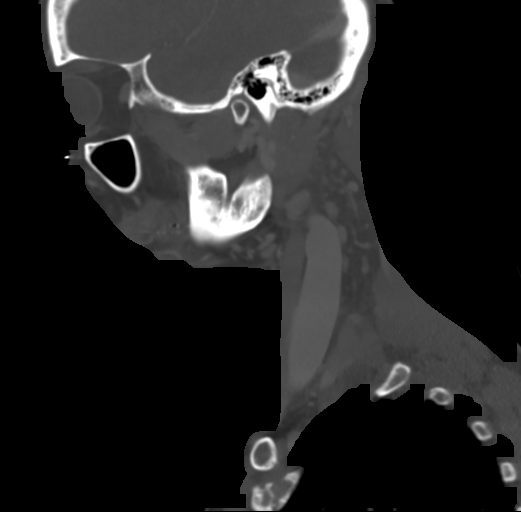

[Series 6: coronal · coronal · 0.33mm/px · 3 of 106 slices shown]
[im 22/106  bone]
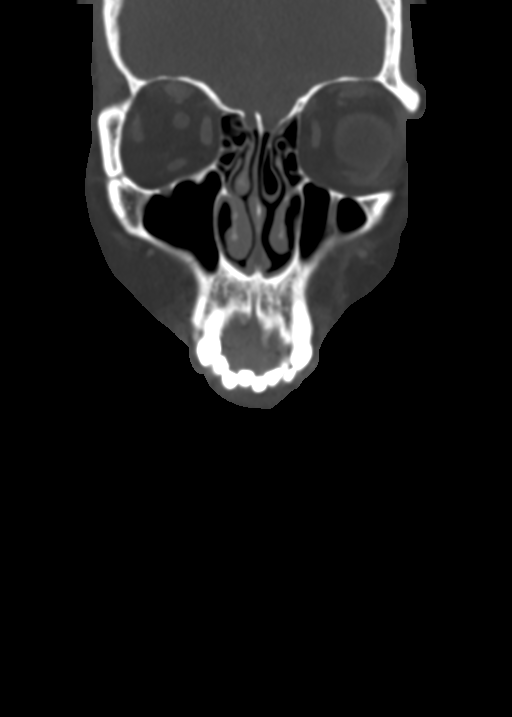
[im 43/106  bone]
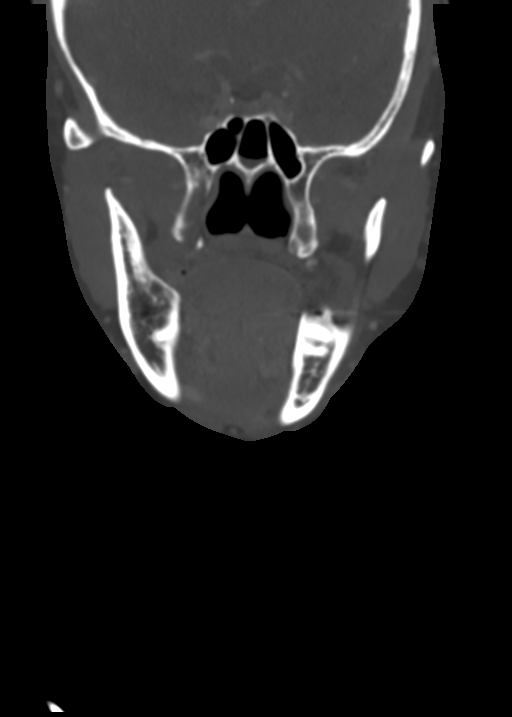
[im 64/106  bone]
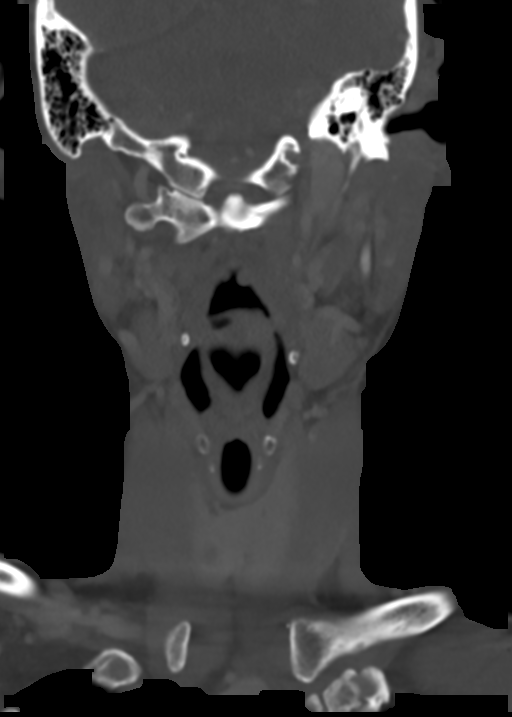

[Series 7: orthogonal · axial · 0.39mm/px · z∈[+1093,+1209]mm · 3 of 118 slices shown]
[im 30/118  bone]
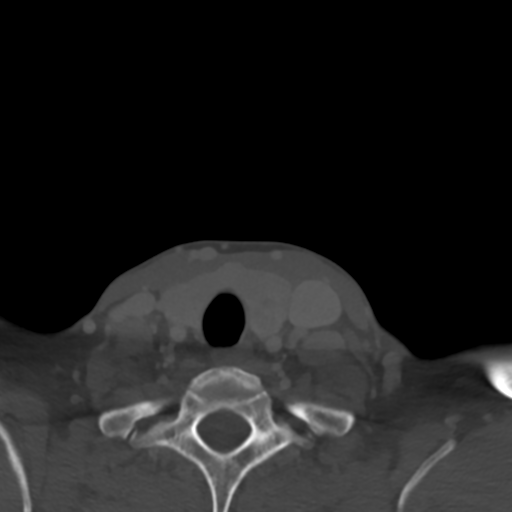
[im 59/118  bone]
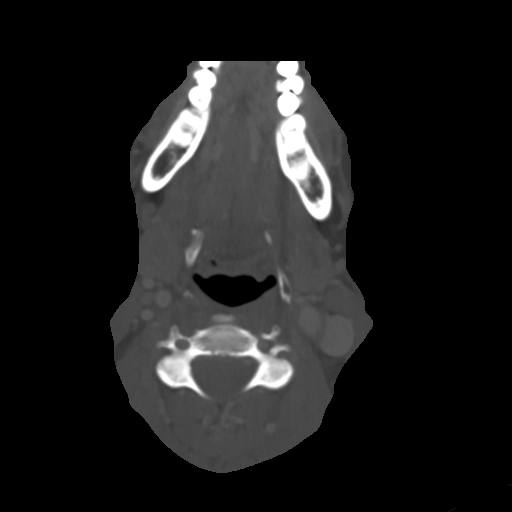
[im 88/118  bone]
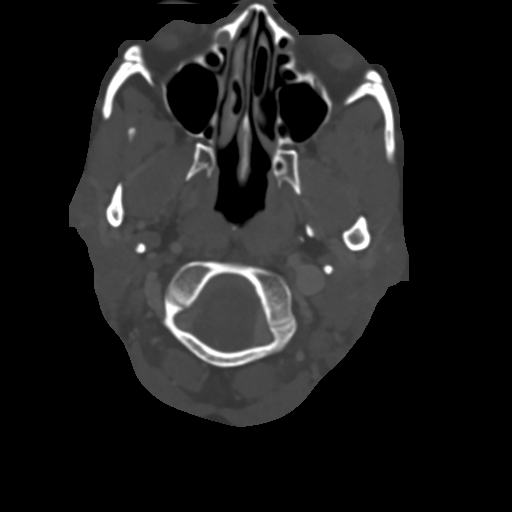

[14 of 33 positions shown; findings below may reference images not displayed]

FINDINGS: PHARYNX AND LARYNX: The nasopharynx, oropharynx and larynx are
normal. Visible portions of the oral cavity, tongue base and floor
of mouth are normal. Normal epiglottis, vallecula and pyriform
sinuses. The larynx is normal. No retropharyngeal abscess, effusion
or lymphadenopathy.

SALIVARY GLANDS: Normal parotid, submandibular and sublingual
glands.

THYROID: Unremarkable

LYMPH NODES: No enlarged or abnormal density lymph nodes. Inferior
left parotid lymph node measures 9 mm.

VASCULAR: Major cervical vessels are patent.

LIMITED INTRACRANIAL: Normal.

VISUALIZED ORBITS: Normal.

MASTOIDS AND VISUALIZED PARANASAL SINUSES: No fluid levels or
advanced mucosal thickening. No mastoid effusion.

SKELETON: No bony spinal canal stenosis. No lytic or blastic
lesions.

UPPER CHEST: Clear.

OTHER: There is subcutaneous inflammatory change of the left pinna
and retroauricular soft tissues. No abscess or fluid collection.
IMPRESSION: 1. Subcutaneous inflammatory change of the left pinna and
retroauricular soft tissues, consistent with cellulitis. No abscess
or fluid collection.
2. Likely reactive inferior left parotid lymph node.

## 2022-08-23 ENCOUNTER — Ambulatory Visit: Payer: Federal, State, Local not specified - PPO | Admitting: Family

## 2022-08-23 NOTE — Progress Notes (Incomplete)
Subjective:   By signing my name below, I, Christie [name], attest that this documentation has been prepared under the direction and in the presence of [name], NP.  08/23/2022.   Patient ID: Christie [name], female    DOB: 05/14/1966, 57 y.o.   MRN: [id number]  No chief complaint on file.   HPI Patient is in today for an office visit.  Ankle pain: Sprained  Mood:  Stable on citalopram.  Hot flashes:  Tobacco use:  Diet:   Denies having any fever, new muscle pain, new moles, congestion, sinus pain, sore throat, chest pain, palpitations, cough, SOB, wheezing, n/v/d, constipation, blood in stool, dysuria, frequency, hematuria, at this time.  Past Medical History:  Diagnosis Date   Anxiety    Hyperglycemia     Past Surgical History:  Procedure Laterality Date   ABLATION  2010   uterine   HYSTEROSCOPY  2010    per patient    Family History  Problem Relation Age of Onset   Hypertension Mother    Heart failure Mother    Lung cancer Mother        s/p resection   Diabetes Maternal Grandmother        type II   Hypertension Maternal Grandmother    Alcohol abuse Other        family hx   Anxiety disorder Other        family hx   Arthritis Other        family hx   Hypertension Other        family hx   Thyroid disease Other        family hx   Gallstones Brother    Hypertension Brother     Social History   Socioeconomic History   Marital status: Married    Spouse name: Not on file   Number of children: Not on file   Years of education: Not on file   Highest education level: Not on file  Occupational History   Not on file  Tobacco Use   Smoking status: Every Day    Packs/day: 0.50    Types: Cigarettes   Smokeless tobacco: Never   Tobacco comments:    10 cigarettes daily  Substance and Sexual Activity   Alcohol use: Yes    Alcohol/week: 1.0 standard drink of alcohol    Types: 1 Glasses of wine per week    Comment: daily 1-2 glass  wine/day   Drug use: Yes    Frequency: 7.0 times per week    Types: Marijuana   Sexual activity: Yes    Birth control/protection: None  Other Topics Concern   Not on file  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      Social Determinants of Health   Financial Resource Strain: Not on file  Food Insecurity: Not on file  Transportation Needs: Not on file  Physical Activity: Not on file  Stress: Not on file  Social Connections: Not on file  Intimate Partner Violence: Not on file    Outpatient Medications Prior to Visit  Medication Sig Dispense Refill   betamethasone dipropionate 0.05 % cream APPLY TO AFFECTED AREA TWICE A DAY AS NEEDED 30 g 1   CALCIUM-VITAMIN D PO Take 1 capsule by mouth daily.     cetirizine (ZYRTEC) 10 MG tablet Take 1 tablet (10  mg total) by mouth daily. 30 tablet 11   citalopram (CELEXA) 20 MG tablet TAKE 1 AND 1/2 TABLETS DAILY BY MOUTH 135 tablet 1   diclofenac sodium (VOLTAREN) 1 % GEL Apply 2 g topically 4 (four) times daily. To affected joint. 100 g 11   EYSUVIS 0.25 % SUSP Place 1 drop into both eyes daily.     fluticasone (FLONASE) 50 MCG/ACT nasal spray USE 2 SPRAYS IN TO EACH NOSTRIL DAILY AS DIRECTED (Patient taking differently: Place 2 sprays into both nostrils daily as needed for allergies.) 48 g 5   gabapentin (NEURONTIN) 300 MG capsule TAKE 1 CAPSULE BY MOUTH EVERYDAY AT BEDTIME 90 capsule 1   varenicline (CHANTIX PAK) 0.5 MG X 11 & 1 MG X 42 tablet Take one 0.5 mg tablet by mouth once daily for 3 days, then increase to one 0.5 mg tablet twice daily for 4 days, then increase to one 1 mg tablet twice daily. 53 tablet 0   No facility-administered medications prior to visit.    No Known Allergies  Review of Systems  Musculoskeletal:  Positive for joint pain (Right Left Ankle).       Objective:    Physical  Exam Constitutional:      Appearance: Normal appearance.  HENT:     Head: Normocephalic and atraumatic.     Right Ear: Tympanic membrane, ear canal and external ear normal.     Left Ear: Tympanic membrane, ear canal and external ear normal.  Eyes:     Extraocular Movements: Extraocular movements intact.     Pupils: Pupils are equal, round, and reactive to light.  Cardiovascular:     Rate and Rhythm: Normal rate and regular rhythm.     Heart sounds: Normal heart sounds. No murmur heard.    No gallop.  Pulmonary:     Effort: Pulmonary effort is normal. No respiratory distress.     Breath sounds: Normal breath sounds. No wheezing or rales.  Skin:    General: Skin is warm and dry.  Neurological:     General: No focal deficit present.     Mental Status: She is alert and oriented to person, place, and time.  Psychiatric:        Mood and Affect: Mood normal.        Behavior: Behavior normal.     There were no vitals taken for this visit. Wt Readings from Last 3 Encounters:  03/18/22 129 lb 3.2 oz (58.6 kg)  09/07/21 122 lb 6.4 oz (55.5 kg)  08/17/21 121 lb (54.9 kg)    Diabetic Foot Exam - Simple   No data filed    Lab Results  Component Value Date   WBC 8.2 09/18/2020   HGB 12.3 09/18/2020   HCT 37.0 09/18/2020   PLT 158 09/18/2020   GLUCOSE 83 03/18/2022   CHOL 215 (H) 08/17/2021   TRIG 62.0 08/17/2021   HDL 92.60 08/17/2021   LDLCALC 110 (H) 08/17/2021   ALT 15 03/18/2022   AST 20 03/18/2022   NA 140 03/18/2022   K 4.1 03/18/2022   CL 105 03/18/2022   CREATININE 1.06 03/18/2022   BUN 21 03/18/2022   CO2 25 03/18/2022   TSH 1.16 03/18/2022   HGBA1C 6.2 03/18/2022    Lab Results  Component Value Date   TSH 1.16 03/18/2022   Lab Results  Component Value Date   WBC 8.2 09/18/2020   HGB 12.3 09/18/2020   HCT 37.0 09/18/2020   MCV 92.5 09/18/2020  PLT 158 09/18/2020   Lab Results  Component Value Date   NA 140 03/18/2022   K 4.1 03/18/2022   CO2 25  03/18/2022   GLUCOSE 83 03/18/2022   BUN 21 03/18/2022   CREATININE 1.06 03/18/2022   BILITOT 0.4 03/18/2022   ALKPHOS 65 03/18/2022   AST 20 03/18/2022   ALT 15 03/18/2022   PROT 6.8 03/18/2022   ALBUMIN 4.5 03/18/2022   CALCIUM 10.0 03/18/2022   ANIONGAP 8 09/18/2020   GFR 58.61 (L) 03/18/2022   Lab Results  Component Value Date   CHOL 215 (H) 08/17/2021   Lab Results  Component Value Date   HDL 92.60 08/17/2021   Lab Results  Component Value Date   LDLCALC 110 (H) 08/17/2021   Lab Results  Component Value Date   TRIG 62.0 08/17/2021   Lab Results  Component Value Date   CHOLHDL 2 08/17/2021   Lab Results  Component Value Date   HGBA1C 6.2 03/18/2022       Assessment & Plan:   Problem List Items Addressed This Visit   None    No orders of the defined types were placed in this encounter.   Alphonzo Grieve, personally preformed the services described in this documentation.  All medical record entries made by the scribe were at my direction and in my presence.  I have reviewed the chart and discharge instructions (if applicable) and agree that the record reflects my personal performance and is accurate and complete. 08/23/2022.  I,Mathew Stumpf,acting as a Education administrator for [employer], NP.,have documented all relevant documentation on the behalf of Nance Pear, NP,as directed by  Nance Pear, NP while in the presence of Nance Pear, NP.   Christie [name]

## 2022-08-24 ENCOUNTER — Other Ambulatory Visit (HOSPITAL_BASED_OUTPATIENT_CLINIC_OR_DEPARTMENT_OTHER): Payer: Self-pay

## 2022-08-24 ENCOUNTER — Ambulatory Visit: Payer: Federal, State, Local not specified - PPO | Admitting: Family

## 2022-08-24 ENCOUNTER — Ambulatory Visit (HOSPITAL_BASED_OUTPATIENT_CLINIC_OR_DEPARTMENT_OTHER)
Admission: RE | Admit: 2022-08-24 | Discharge: 2022-08-24 | Disposition: A | Discharge status: Home / Self Care | Payer: Federal, State, Local not specified - PPO | Source: Ambulatory Visit | Attending: Family | Admitting: Family

## 2022-08-24 VITALS — BP 122/80 | HR 70 | Resp 18 | Ht 64.0 in | Wt 128.2 lb

## 2022-08-24 DIAGNOSIS — S8265XA Nondisplaced fracture of lateral malleolus of left fibula, initial encounter for closed fracture: Secondary | ICD-10-CM | POA: Diagnosis not present

## 2022-08-24 DIAGNOSIS — S82432A Displaced oblique fracture of shaft of left fibula, initial encounter for closed fracture: Secondary | ICD-10-CM | POA: Diagnosis not present

## 2022-08-24 DIAGNOSIS — M25572 Pain in left ankle and joints of left foot: Secondary | ICD-10-CM

## 2022-08-24 DIAGNOSIS — J309 Allergic rhinitis, unspecified: Secondary | ICD-10-CM | POA: Diagnosis not present

## 2022-08-24 MED ORDER — FLUTICASONE PROPIONATE 50 MCG/ACT NA SUSP: 2.0000 | Freq: Every day | NASAL | 5 refills | Status: AC | PRN

## 2022-08-24 NOTE — Progress Notes (Addendum)
Subjective:   By signing my name below, I, Christie [name], attest that this documentation has been prepared under the direction and in the presence of [name], NP. 08/24/2022   Patient ID: Christie [name], female    DOB: [birth year], 57 y.o.   MRN: [id number]  Chief Complaint  Patient presents with   Ankle Injury    Onset: 5 days -- left     HPI Patient is in today for an office visit.   Left ankle swelling: She recently fell  backwards on the stairs and now has pain and swelling in her left ankle. She has been elevating and icing the ankle to manage it.   Flonase: She is requesting a refill on 50 mcg Flonase during this visit.   Past Medical History:  Diagnosis Date   Anxiety    Hyperglycemia     Past Surgical History:  Procedure Laterality Date   ABLATION  2010   uterine   HYSTEROSCOPY  2010    per patient    Family History  Problem Relation Age of Onset   Hypertension Mother    Heart failure Mother    Lung cancer Mother        s/p resection   Diabetes Maternal Grandmother        type II   Hypertension Maternal Grandmother    Alcohol abuse Other        family hx   Anxiety disorder Other        family hx   Arthritis Other        family hx   Hypertension Other        family hx   Thyroid disease Other        family hx   Gallstones Brother    Hypertension Brother     Social History   Socioeconomic History   Marital status: Married    Spouse name: Not on file   Number of children: Not on file   Years of education: Not on file   Highest education level: Not on file  Occupational History   Not on file  Tobacco Use   Smoking status: Every Day    Packs/day: 0.50    Types: Cigarettes   Smokeless tobacco: Never   Tobacco comments:    10 cigarettes daily  Substance and Sexual Activity   Alcohol use: Yes    Alcohol/week: 1.0 standard drink of alcohol    Types: 1 Glasses of wine per week    Comment: daily 1-2 glass wine/day   Drug  use: Yes    Frequency: 7.0 times per week    Types: Marijuana   Sexual activity: Yes    Birth control/protection: None  Other Topics Concern   Not on file  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      Social Determinants of Health   Financial Resource Strain: Not on file  Food Insecurity: Not on file  Transportation Needs: Not on file  Physical Activity: Not on file  Stress: Not on file  Social Connections: Not on file  Intimate Partner Violence: Not on file    Outpatient Medications Prior to Visit  Medication Sig Dispense Refill   betamethasone dipropionate 0.05 % cream APPLY TO AFFECTED AREA TWICE A DAY AS NEEDED 30 g 1   CALCIUM-VITAMIN D PO Take 1 capsule by mouth  daily.     cetirizine (ZYRTEC) 10 MG tablet Take 1 tablet (10 mg total) by mouth daily. 30 tablet 11   citalopram (CELEXA) 20 MG tablet TAKE 1 AND 1/2 TABLETS DAILY BY MOUTH 135 tablet 1   diclofenac sodium (VOLTAREN) 1 % GEL Apply 2 g topically 4 (four) times daily. To affected joint. 100 g 11   EYSUVIS 0.25 % SUSP Place 1 drop into both eyes daily.     gabapentin (NEURONTIN) 300 MG capsule TAKE 1 CAPSULE BY MOUTH EVERYDAY AT BEDTIME 90 capsule 1   varenicline (CHANTIX PAK) 0.5 MG X 11 & 1 MG X 42 tablet Take one 0.5 mg tablet by mouth once daily for 3 days, then increase to one 0.5 mg tablet twice daily for 4 days, then increase to one 1 mg tablet twice daily. 53 tablet 0   fluticasone (FLONASE) 50 MCG/ACT nasal spray USE 2 SPRAYS IN TO EACH NOSTRIL DAILY AS DIRECTED (Patient taking differently: Place 2 sprays into both nostrils daily as needed for allergies.) 48 g 5   No facility-administered medications prior to visit.    No Known Allergies  ROS See HPI    Objective:    Physical Exam Constitutional:      Appearance: Normal appearance.  HENT:     Head: Normocephalic and atraumatic.      Right Ear: External ear normal.     Left Ear: External ear normal.  Eyes:     Extraocular Movements: Extraocular movements intact.     Pupils: Pupils are equal, round, and reactive to light.  Pulmonary:     Effort: Pulmonary effort is normal. No respiratory distress.     Breath sounds: Normal breath sounds. No wheezing or rales.  Musculoskeletal:     Comments: Lateral swelling left ankle Left lateral ankle is tender to the touch  Skin:    General: Skin is warm.  Neurological:     Mental Status: She is alert and oriented to person, place, and time.  Psychiatric:        Judgment: Judgment normal.     BP 122/80   Pulse 70   Resp 18   Ht '5\' 4"'$  (1.626 m)   Wt 128 lb 3.2 oz (58.2 kg)   SpO2 98%   BMI 22.01 kg/m  Wt Readings from Last 3 Encounters:  08/24/22 128 lb 3.2 oz (58.2 kg)  03/18/22 129 lb 3.2 oz (58.6 kg)  09/07/21 122 lb 6.4 oz (55.5 kg)       Assessment & Plan:  Acute left ankle pain Assessment & Plan: New. Will obtain x-ray to rule out fracture.  Orders: -     DG Ankle Complete Left; Future  Allergic rhinitis, unspecified seasonality, unspecified trigger Assessment & Plan: Stable, requesting refill of flonase.    Closed nondisplaced fracture of lateral malleolus of left fibula, initial encounter Assessment & Plan: X-ray reveals fracture of left fibula.  Pt was given Cam Boot, note for work for the next 2 days and referred to orthopedics for further evaluation.   Orders: -     Ambulatory referral to Orthopedics  Other orders -     Fluticasone Propionate; Place 2 sprays into both nostrils daily as needed for allergies.  Dispense: 9.9 mL; Refill: 5    I, Nance Pear, NP, personally preformed the services described in this documentation.  All medical record entries made by the scribe were at my direction and in my presence.  I have reviewed the chart and  discharge instructions (if applicable) and agree that the record reflects my personal  performance and is accurate and complete. 08/24/2022  Nance Pear, NP  Harvest Dark as a scribe for Nance Pear, NP.,have documented all relevant documentation on the behalf of Nance Pear, NP,as directed by  Nance Pear, NP while in the presence of Nance Pear, NP.

## 2022-08-24 NOTE — Assessment & Plan Note (Signed)
X-ray reveals fracture of left fibula.  Pt was given Cam Boot, note for work for the next 2 days and referred to orthopedics for further evaluation.

## 2022-08-24 NOTE — Addendum Note (Signed)
Addended by: [name] on: 08/24/2022 08:20 AM   Modules accepted: Orders, Level of Service

## 2022-08-24 NOTE — Assessment & Plan Note (Signed)
New. Will obtain x-ray to rule out fracture.

## 2022-08-24 NOTE — Assessment & Plan Note (Signed)
Stable, requesting refill of flonase.

## 2022-08-25 ENCOUNTER — Ambulatory Visit (INDEPENDENT_AMBULATORY_CARE_PROVIDER_SITE_OTHER): Payer: Federal, State, Local not specified - PPO | Admitting: Orthopedic Surgery

## 2022-08-25 ENCOUNTER — Encounter: Payer: Self-pay | Admitting: Orthopedic Surgery

## 2022-08-25 ENCOUNTER — Encounter (HOSPITAL_COMMUNITY): Payer: Self-pay | Admitting: Orthopedic Surgery

## 2022-08-25 DIAGNOSIS — S8262XA Displaced fracture of lateral malleolus of left fibula, initial encounter for closed fracture: Secondary | ICD-10-CM | POA: Diagnosis not present

## 2022-08-25 NOTE — Progress Notes (Addendum)
Office Visit Note   Patient: Christie [name]           Date of Birth: 29-Jul-1965           MRN: [id number] Visit Date: 08/25/2022              Requested by: [name], NP [address] [place],  [place] [address] PCP: [name], NP  Chief Complaint  Patient presents with   Left Ankle - Fracture      HPI: Patient is a 57 year old woman who is seen for initial evaluation for Weber B displaced left fibular fracture.  She is currently weightbearing in a cam boot.  Radiographs obtained yesterday.  Patient states she fell down a flight of stairs.  Assessment & Plan: Visit Diagnoses:  1. Closed displaced fracture of lateral malleolus of left fibula, initial encounter     Plan: Will plan for open reduction internal fixation for the displaced fibular fracture.  Risks and benefits were discussed including infection neurovascular injury persistent pain need for additional surgery.  Patient states she understands wished to proceed at this time she will be strict nonweightbearing for 2 weeks and a note was provided to be out of work for 2 weeks.  Discussed that she needs to completely stop smoking.  With smoking she has an increased risk of complications including risks of the wound not healing in the bone not healing and infection.  Follow-Up Instructions: Return in about 1 week (around 09/01/2022).   Ortho Exam  Patient is alert, oriented, no adenopathy, well-dressed, normal affect, normal respiratory effort. Examination patient has a good dorsalis pedis pulse.  There is no blistering minimal swelling.  She is tender to palpation of the deltoid and the fibula is tender to palpation.  Radiographs shows a displaced Weber B fibular fracture with lateral displacement of the talus.  No medial malleolar fracture.  Imaging: No results found. No images are attached to the encounter.  Labs: Lab Results  Component Value Date   HGBA1C 6.2 03/18/2022    HGBA1C 6.1 08/17/2021     Lab Results  Component Value Date   ALBUMIN 4.5 03/18/2022   ALBUMIN 4.5 08/17/2021   ALBUMIN 3.7 09/18/2020    No results found for: "MG" No results found for: "VD25OH"  No results found for: "PREALBUMIN"    Latest Ref Rng & Units 09/18/2020    2:50 AM 09/17/2020   10:58 PM 05/26/2014    6:50 PM  CBC EXTENDED  WBC 4.0 - 10.5 K/uL 8.2  10.0  8.5   RBC 3.87 - 5.11 MIL/uL 4.00  4.08  4.27   Hemoglobin 12.0 - 15.0 g/dL 12.3  12.8  12.6   HCT 36.0 - 46.0 % 37.0  38.2  39.3   Platelets 150 - 400 K/uL 158  159  170.0   NEUT# 1.7 - 7.7 K/uL  6.8  4.7   Lymph# 0.7 - 4.0 K/uL  2.0  3.1      There is no height or weight on file to calculate BMI.  Orders:  No orders of the defined types were placed in this encounter.  No orders of the defined types were placed in this encounter.    Procedures: No procedures performed  Clinical Data: No additional findings.  ROS:  All other systems negative, except as noted in the HPI. Review of Systems  Objective: Vital Signs: There were no vitals taken for this visit.  Specialty Comments:  No specialty  comments available.  PMFS History: Patient Active Problem List   Diagnosis Date Noted   Acute left ankle pain 08/24/2022   Closed nondisplaced fracture of lateral malleolus of left fibula 08/24/2022   Acute conjunctivitis of both eyes 09/07/2021   Hyperglycemia 08/18/2021   Multinodular goiter 01/14/2020   Eczema 07/11/2015   Tobacco abuse 09/19/2014   Preventative health care 06/01/2014   Hot flashes 05/21/2013   Epicondylitis elbow, medial 05/21/2013   Alopecia 02/25/2013   Back pain 04/03/2012   Anxiety state 06/25/2010   Allergic rhinitis 06/25/2010   Past Medical History:  Diagnosis Date   Anxiety    Eczema 10/2015   HSV (herpes simplex virus) infection    Hyperglycemia    Hyperlipidemia 08/2021   no meds, diet controlled   Multinodular goiter 03/18/2022    Family History  Problem  Relation Age of Onset   Hypertension Mother    Heart failure Mother    Lung cancer Mother        s/p resection   Diabetes Maternal Grandmother        type II   Hypertension Maternal Grandmother    Alcohol abuse Other        family hx   Anxiety disorder Other        family hx   Arthritis Other        family hx   Hypertension Other        family hx   Thyroid disease Other        family hx   Gallstones Brother    Hypertension Brother     Past Surgical History:  Procedure Laterality Date   ABLATION  2010   uterine   HYSTEROSCOPY  2010    per patient   Social History   Occupational History   Not on file  Tobacco Use   Smoking status: Every Day    Packs/day: .5    Types: Cigarettes   Smokeless tobacco: Never   Tobacco comments:    10 cigarettes daily  Vaping Use   Vaping Use: Never used  Substance and Sexual Activity   Alcohol use: Not Currently    Alcohol/week: 7.0 - 14.0 standard drinks of alcohol    Types: 7 - 14 Standard drinks or equivalent per week    Comment: daily 1-2 glass wine/day   Drug use: Yes    Frequency: 7.0 times per week    Types: Marijuana    Comment: Last use was on   Sexual activity: Yes    Birth control/protection: Other-see comments    Comment: uterine ablation

## 2022-08-25 NOTE — Progress Notes (Signed)
PCP - [name] Cardiologist - n/a  Chest x-ray - n/a EKG - n/a Stress Test - n/a ECHO - n/a Cardiac Cath - n/a  ICD Pacemaker/Loop - n/a  Sleep Study -  n/a CPAP - none  Diabetes - n/a  ERAS: Clear liquids til 9:30 AM DOS.  Anesthesia review: No  STOP now taking any Aspirin (unless otherwise instructed by your surgeon), Aleve, Naproxen, Ibuprofen, Motrin, Advil, Goody's, BC's, all herbal medications, fish oil, and all vitamins.   Coronavirus Screening Do you have any of the following symptoms:  Cough yes/no: No Fever (>100.26F)  yes/no: No Runny nose yes/no: No Sore throat yes/no: No Difficulty breathing/shortness of breath  yes/no: No  Have you traveled in the last 14 days and where? yes/no: No  Patient verbalized understanding of instructions that were given via phone.

## 2022-08-26 ENCOUNTER — Encounter (HOSPITAL_COMMUNITY)
Admission: RE | Disposition: A | Discharge status: Home / Self Care | Payer: Self-pay | Source: Home / Self Care | Attending: Orthopedic Surgery

## 2022-08-26 ENCOUNTER — Ambulatory Visit (HOSPITAL_COMMUNITY)
Admission: RE | Admit: 2022-08-26 | Discharge: 2022-08-26 | Disposition: A | Discharge status: Home / Self Care | Payer: Federal, State, Local not specified - PPO | Attending: Orthopedic Surgery | Admitting: Orthopedic Surgery

## 2022-08-26 ENCOUNTER — Telehealth: Payer: Self-pay | Admitting: *Deleted

## 2022-08-26 ENCOUNTER — Encounter (HOSPITAL_COMMUNITY): Payer: Self-pay | Admitting: Orthopedic Surgery

## 2022-08-26 ENCOUNTER — Other Ambulatory Visit: Payer: Self-pay

## 2022-08-26 ENCOUNTER — Ambulatory Visit (HOSPITAL_COMMUNITY): Payer: Federal, State, Local not specified - PPO

## 2022-08-26 ENCOUNTER — Ambulatory Visit (HOSPITAL_COMMUNITY): Payer: Federal, State, Local not specified - PPO | Admitting: Anesthesiology

## 2022-08-26 DIAGNOSIS — F17210 Nicotine dependence, cigarettes, uncomplicated: Secondary | ICD-10-CM | POA: Insufficient documentation

## 2022-08-26 DIAGNOSIS — F419 Anxiety disorder, unspecified: Secondary | ICD-10-CM | POA: Diagnosis not present

## 2022-08-26 DIAGNOSIS — X58XXXA Exposure to other specified factors, initial encounter: Secondary | ICD-10-CM | POA: Insufficient documentation

## 2022-08-26 DIAGNOSIS — S8262XA Displaced fracture of lateral malleolus of left fibula, initial encounter for closed fracture: Secondary | ICD-10-CM | POA: Diagnosis not present

## 2022-08-26 DIAGNOSIS — E042 Nontoxic multinodular goiter: Secondary | ICD-10-CM | POA: Diagnosis not present

## 2022-08-26 DIAGNOSIS — S82892A Other fracture of left lower leg, initial encounter for closed fracture: Secondary | ICD-10-CM | POA: Diagnosis not present

## 2022-08-26 DIAGNOSIS — D649 Anemia, unspecified: Secondary | ICD-10-CM | POA: Diagnosis not present

## 2022-08-26 DIAGNOSIS — G8918 Other acute postprocedural pain: Secondary | ICD-10-CM | POA: Diagnosis not present

## 2022-08-26 DIAGNOSIS — E785 Hyperlipidemia, unspecified: Secondary | ICD-10-CM | POA: Insufficient documentation

## 2022-08-26 DIAGNOSIS — S8265XA Nondisplaced fracture of lateral malleolus of left fibula, initial encounter for closed fracture: Secondary | ICD-10-CM | POA: Diagnosis not present

## 2022-08-26 HISTORY — DX: Herpesviral infection, unspecified: B00.9

## 2022-08-26 HISTORY — DX: Anemia, unspecified: D64.9

## 2022-08-26 HISTORY — PX: ORIF ANKLE FRACTURE: SHX5408

## 2022-08-26 LAB — CBC
HCT: 42 % (ref 36.0–46.0)
Hemoglobin: 13.5 g/dL (ref 12.0–15.0)
MCH: 30.5 pg (ref 26.0–34.0)
MCHC: 32.1 g/dL (ref 30.0–36.0)
MCV: 94.8 fL (ref 80.0–100.0)
Platelets: 180 10*3/uL (ref 150–400)
RBC: 4.43 MIL/uL (ref 3.87–5.11)
RDW: 13.9 % (ref 11.5–15.5)
WBC: 7.3 10*3/uL (ref 4.0–10.5)
nRBC: 0 % (ref 0.0–0.2)

## 2022-08-26 LAB — BASIC METABOLIC PANEL
Anion gap: 8 (ref 5–15)
BUN: 16 mg/dL (ref 6–20)
CO2: 26 mmol/L (ref 22–32)
Calcium: 9.7 mg/dL (ref 8.9–10.3)
Chloride: 104 mmol/L (ref 98–111)
Creatinine, Ser: 0.94 mg/dL (ref 0.44–1.00)
GFR, Estimated: >60 mL/min (ref >60)
Glucose, Bld: 97 mg/dL (ref 70–99)
Potassium: 4.1 mmol/L (ref 3.5–5.1)
Sodium: 138 mmol/L (ref 135–145)

## 2022-08-26 LAB — GLUCOSE, CAPILLARY: Glucose-Capillary: 110 mg/dL — ABNORMAL HIGH (ref 70–99)

## 2022-08-26 SURGERY — OPEN REDUCTION INTERNAL FIXATION (ORIF) ANKLE FRACTURE
Anesthesia: General | Site: Ankle | Laterality: Left

## 2022-08-26 MED ORDER — HYDROCODONE-ACETAMINOPHEN 5-325 MG PO TABS: 1.0000 | ORAL_TABLET | ORAL | 0 refills | Status: DC | PRN

## 2022-08-26 MED ORDER — FENTANYL CITRATE (PF) 100 MCG/2ML IJ SOLN
INTRAMUSCULAR | Status: AC
  Administered 2022-08-26: 50 ug via INTRAVENOUS
  Filled 2022-08-26: qty 2

## 2022-08-26 MED ORDER — DEXAMETHASONE SODIUM PHOSPHATE 10 MG/ML IJ SOLN
INTRAMUSCULAR | Status: AC
  Filled 2022-08-26: qty 1

## 2022-08-26 MED ORDER — MIDAZOLAM HCL 2 MG/2ML IJ SOLN
INTRAMUSCULAR | Status: AC
  Administered 2022-08-26: 1 mg via INTRAVENOUS
  Filled 2022-08-26: qty 2

## 2022-08-26 MED ORDER — MIDAZOLAM HCL 2 MG/2ML IJ SOLN: 1.0000 mg | Freq: Once | INTRAMUSCULAR | Status: AC

## 2022-08-26 MED ORDER — FENTANYL CITRATE (PF) 100 MCG/2ML IJ SOLN: 25.0000 ug | INTRAMUSCULAR | Status: DC | PRN

## 2022-08-26 MED ORDER — PROPOFOL 10 MG/ML IV BOLUS
INTRAVENOUS | Status: AC
  Filled 2022-08-26: qty 20

## 2022-08-26 MED ORDER — FENTANYL CITRATE (PF) 250 MCG/5ML IJ SOLN
INTRAMUSCULAR | Status: DC | PRN
  Administered 2022-08-26: 50 ug via INTRAVENOUS

## 2022-08-26 MED ORDER — FENTANYL CITRATE (PF) 100 MCG/2ML IJ SOLN: 50.0000 ug | Freq: Once | INTRAMUSCULAR | Status: AC

## 2022-08-26 MED ORDER — BUPIVACAINE HCL (PF) 0.5 % IJ SOLN
INTRAMUSCULAR | Status: DC | PRN
  Administered 2022-08-26: 20 mL via PERINEURAL

## 2022-08-26 MED ORDER — ONDANSETRON HCL 4 MG/2ML IJ SOLN: 4.0000 mg | Freq: Once | INTRAMUSCULAR | Status: DC | PRN

## 2022-08-26 MED ORDER — CEFAZOLIN SODIUM-DEXTROSE 2-4 GM/100ML-% IV SOLN
2.0000 g | INTRAVENOUS | Status: AC
  Administered 2022-08-26: 2 g via INTRAVENOUS
  Filled 2022-08-26: qty 100

## 2022-08-26 MED ORDER — SUCCINYLCHOLINE CHLORIDE 200 MG/10ML IV SOSY
PREFILLED_SYRINGE | INTRAVENOUS | Status: DC | PRN
  Administered 2022-08-26: 20 mg via INTRAVENOUS

## 2022-08-26 MED ORDER — EPHEDRINE SULFATE-NACL 50-0.9 MG/10ML-% IV SOSY
PREFILLED_SYRINGE | INTRAVENOUS | Status: DC | PRN
  Administered 2022-08-26: 10 mg via INTRAVENOUS

## 2022-08-26 MED ORDER — LACTATED RINGERS IV SOLN: INTRAVENOUS | Status: DC

## 2022-08-26 MED ORDER — OXYCODONE HCL 5 MG/5ML PO SOLN: 5.0000 mg | Freq: Once | ORAL | Status: DC | PRN

## 2022-08-26 MED ORDER — DEXAMETHASONE SODIUM PHOSPHATE 10 MG/ML IJ SOLN
INTRAMUSCULAR | Status: DC | PRN
  Administered 2022-08-26: 10 mg via INTRAVENOUS

## 2022-08-26 MED ORDER — BUPIVACAINE LIPOSOME 1.3 % IJ SUSP
INTRAMUSCULAR | Status: DC | PRN
  Administered 2022-08-26: 10 mL via PERINEURAL

## 2022-08-26 MED ORDER — FENTANYL CITRATE (PF) 250 MCG/5ML IJ SOLN
INTRAMUSCULAR | Status: AC
  Filled 2022-08-26: qty 5

## 2022-08-26 MED ORDER — 0.9 % SODIUM CHLORIDE (POUR BTL) OPTIME
TOPICAL | Status: DC | PRN
  Administered 2022-08-26: 1000 mL

## 2022-08-26 MED ORDER — LIDOCAINE 2% (20 MG/ML) 5 ML SYRINGE
INTRAMUSCULAR | Status: AC
  Filled 2022-08-26: qty 5

## 2022-08-26 MED ORDER — LIDOCAINE 2% (20 MG/ML) 5 ML SYRINGE
INTRAMUSCULAR | Status: DC | PRN
  Administered 2022-08-26: 100 mg via INTRAVENOUS

## 2022-08-26 MED ORDER — ONDANSETRON HCL 4 MG/2ML IJ SOLN
INTRAMUSCULAR | Status: AC
  Filled 2022-08-26: qty 2

## 2022-08-26 MED ORDER — ONDANSETRON HCL 4 MG/2ML IJ SOLN
INTRAMUSCULAR | Status: DC | PRN
  Administered 2022-08-26: 4 mg via INTRAVENOUS

## 2022-08-26 MED ORDER — EPHEDRINE 5 MG/ML INJ
INTRAVENOUS | Status: AC
  Filled 2022-08-26: qty 5

## 2022-08-26 MED ORDER — CHLORHEXIDINE GLUCONATE 0.12 % MT SOLN
15.0000 mL | Freq: Once | OROMUCOSAL | Status: AC
  Administered 2022-08-26: 15 mL via OROMUCOSAL
  Filled 2022-08-26: qty 15

## 2022-08-26 MED ORDER — OXYCODONE HCL 5 MG PO TABS: 5.0000 mg | ORAL_TABLET | Freq: Once | ORAL | Status: DC | PRN

## 2022-08-26 MED ORDER — ORAL CARE MOUTH RINSE: 15.0000 mL | Freq: Once | OROMUCOSAL | Status: AC

## 2022-08-26 MED ORDER — PROPOFOL 10 MG/ML IV BOLUS
INTRAVENOUS | Status: DC | PRN
  Administered 2022-08-26: 60 mg via INTRAVENOUS
  Administered 2022-08-26: 200 mg via INTRAVENOUS

## 2022-08-26 MED ORDER — NICOTINE 21 MG/24HR TD PT24: 21.0000 mg | MEDICATED_PATCH | TRANSDERMAL | 1 refills | Status: AC

## 2022-08-26 SURGICAL SUPPLY — 47 items
BAG COUNTER SPONGE SURGICOUNT (BAG) ×2 IMPLANT
BAG SPNG CNTER NS LX DISP (BAG) ×1
BANDAGE ESMARK 6X9 LF (GAUZE/BANDAGES/DRESSINGS) IMPLANT
BIT DRILL 110X2.5XQCK CNCT (BIT) IMPLANT
BIT DRILL 2.5 (BIT) ×1
BIT DRL 110X2.5XQCK CNCT (BIT) ×1
BNDG CMPR 5X6 CHSV STRCH STRL (GAUZE/BANDAGES/DRESSINGS) ×1
BNDG CMPR 9X6 STRL LF SNTH (GAUZE/BANDAGES/DRESSINGS)
BNDG COHESIVE 4X5 TAN STRL (GAUZE/BANDAGES/DRESSINGS) ×2 IMPLANT
BNDG COHESIVE 6X5 TAN ST LF (GAUZE/BANDAGES/DRESSINGS) IMPLANT
BNDG ESMARK 6X9 LF (GAUZE/BANDAGES/DRESSINGS)
BNDG GAUZE DERMACEA FLUFF 4 (GAUZE/BANDAGES/DRESSINGS) ×2 IMPLANT
BNDG GZE DERMACEA 4 6PLY (GAUZE/BANDAGES/DRESSINGS) ×1
COVER SURGICAL LIGHT HANDLE (MISCELLANEOUS) ×2 IMPLANT
DRAPE OEC MINIVIEW 54X84 (DRAPES) IMPLANT
DRAPE U-SHAPE 47X51 STRL (DRAPES) ×2 IMPLANT
DRSG ADAPTIC 3X8 NADH LF (GAUZE/BANDAGES/DRESSINGS) ×2 IMPLANT
DRSG CURAD 3X16 NADH (PACKING) IMPLANT
DURAPREP 26ML APPLICATOR (WOUND CARE) ×2 IMPLANT
ELECT REM PT RETURN 9FT ADLT (ELECTROSURGICAL) ×1
ELECTRODE REM PT RTRN 9FT ADLT (ELECTROSURGICAL) ×2 IMPLANT
GAUZE PAD ABD 8X10 STRL (GAUZE/BANDAGES/DRESSINGS) ×2 IMPLANT
GAUZE SPONGE 4X4 12PLY STRL (GAUZE/BANDAGES/DRESSINGS) ×2 IMPLANT
GLOVE BIOGEL PI IND STRL 9 (GLOVE) ×2 IMPLANT
GLOVE SURG ORTHO 9.0 STRL STRW (GLOVE) ×2 IMPLANT
GOWN STRL REUS W/ TWL XL LVL3 (GOWN DISPOSABLE) ×6 IMPLANT
GOWN STRL REUS W/TWL XL LVL3 (GOWN DISPOSABLE) ×3
KIT BASIN OR (CUSTOM PROCEDURE TRAY) ×2 IMPLANT
KIT TURNOVER KIT B (KITS) ×2 IMPLANT
MANIFOLD NEPTUNE II (INSTRUMENTS) ×2 IMPLANT
NS IRRIG 1000ML POUR BTL (IV SOLUTION) ×2 IMPLANT
PACK ORTHO EXTREMITY (CUSTOM PROCEDURE TRAY) ×2 IMPLANT
PAD ARMBOARD 7.5X6 YLW CONV (MISCELLANEOUS) ×4 IMPLANT
PLATE 6HOLE 1/3 TUBULAR (Plate) IMPLANT
SCREW CORT 2.5X20X3.5XST SM (Screw) IMPLANT
SCREW CORTICAL 3.5X12 (Screw) IMPLANT
SCREW CORTICAL 3.5X14 (Screw) IMPLANT
SCREW CORTICAL 3.5X20 (Screw) ×2 IMPLANT
STAPLER VISISTAT 35W (STAPLE) IMPLANT
SUCTION FRAZIER HANDLE 10FR (MISCELLANEOUS) ×1
SUCTION TUBE FRAZIER 10FR DISP (MISCELLANEOUS) ×2 IMPLANT
SUT ETHILON 2 0 PSLX (SUTURE) IMPLANT
SUT VIC AB 2-0 CT1 27 (SUTURE) ×1
SUT VIC AB 2-0 CT1 TAPERPNT 27 (SUTURE) ×2 IMPLANT
TOWEL GREEN STERILE (TOWEL DISPOSABLE) ×2 IMPLANT
TOWEL GREEN STERILE FF (TOWEL DISPOSABLE) ×2 IMPLANT
TUBE CONNECTING 12X1/4 (SUCTIONS) ×2 IMPLANT

## 2022-08-26 NOTE — H&P (Signed)
Christie [name] is an 57 y.o. female.   Chief Complaint: Left ankle pain and deformity. HPI: Patient is a 57 year old woman who is seen for initial evaluation for Weber B displaced left fibular fracture. She is currently weightbearing in a cam boot. Radiographs obtained yesterday. Patient states she fell down a flight of stairs.   Past Medical History:  Diagnosis Date   Anemia    "years ago"   Anxiety    Eczema 10/2015   HSV (herpes simplex virus) infection    Hyperglycemia    Hyperlipidemia 08/2021   no meds, diet controlled   Multinodular goiter 03/18/2022    Past Surgical History:  Procedure Laterality Date   ABLATION  06/13/2008   uterine   CESAREAN SECTION     x 1   COLONOSCOPY     HYSTEROSCOPY  06/13/2008   per patient   WISDOM TOOTH EXTRACTION      Family History  Problem Relation Age of Onset   Hypertension Mother    Heart failure Mother    Lung cancer Mother        s/p resection   Diabetes Maternal Grandmother        type II   Hypertension Maternal Grandmother    Alcohol abuse Other        family hx   Anxiety disorder Other        family hx   Arthritis Other        family hx   Hypertension Other        family hx   Thyroid disease Other        family hx   Gallstones Brother    Hypertension Brother    Social History:  reports that she has been smoking cigarettes. She has been smoking an average of .5 packs per day. She has never used smokeless tobacco. She reports that she does not currently use alcohol after a past usage of about 7.0 - 14.0 standard drinks of alcohol per week. She reports current drug use. Frequency: 7.00 times per week. Drug: Marijuana.  Allergies: No Known Allergies  No medications prior to admission.    No results found for this or any previous visit (from the past 48 hour(s)). No results found.  Review of Systems  All other systems reviewed and are negative.   There were no vitals taken for this visit. Physical  Exam  Patient is alert, oriented, no adenopathy, well-dressed, normal affect, normal respiratory effort. Examination patient has a good dorsalis pedis pulse.  There is no blistering minimal swelling.  She is tender to palpation of the deltoid and the fibula is tender to palpation.  Radiographs shows a displaced Weber B fibular fracture with lateral displacement of the talus.  No medial malleolar fracture. Assessment/Plan 1. Closed displaced fracture of lateral malleolus of left fibula, initial encounter       Plan: Will plan for open reduction internal fixation for the displaced fibular fracture.  Risks and benefits were discussed including infection neurovascular injury persistent pain need for additional surgery.  Patient states she understands wished to proceed at this time she will be strict nonweightbearing for 2 weeks and a note was provided to be out of work for 2 weeks.  Discussed that she needs to completely stop smoking.  With smoking she has an increased risk of complications including risks of the wound not healing in the bone not healing and infection.  [name], MD 08/26/2022, 6:51 AM

## 2022-08-26 NOTE — Anesthesia Procedure Notes (Signed)
Procedure Name: LMA Insertion Date/Time: 08/26/2022 2:17 PM  Performed by: Lind Guest, CRNAPre-anesthesia Checklist: Patient identified, Emergency Drugs available, Suction available, Timeout performed and Patient being monitored Patient Re-evaluated:Patient Re-evaluated prior to induction Oxygen Delivery Method: Circle system utilized Preoxygenation: Pre-oxygenation with 100% oxygen Induction Type: IV induction LMA: LMA with gastric port inserted LMA Size: 4.0 Number of attempts: 1 Placement Confirmation: positive ETCO2 and breath sounds checked- equal and bilateral Tube secured with: Tape

## 2022-08-26 NOTE — Anesthesia Preprocedure Evaluation (Signed)
Anesthesia Evaluation  Patient identified by MRN, date of birth, ID band Patient awake    Reviewed: Allergy & Precautions, NPO status , Patient's Chart, lab work & pertinent test results  Airway Mallampati: II  TM Distance: >3 FB Neck ROM: Full    Dental no notable dental hx.    Pulmonary Current Smoker and Patient abstained from smoking.   Pulmonary exam normal breath sounds clear to auscultation       Cardiovascular negative cardio ROS Normal cardiovascular exam Rhythm:Regular Rate:Normal     Neuro/Psych   Anxiety     negative neurological ROS     GI/Hepatic negative GI ROS, Neg liver ROS,,,  Endo/Other  Hyperlipidemia Multinodular goiter  Renal/GU negative Renal ROS  negative genitourinary   Musculoskeletal Left lateral malleolus Fx   Abdominal   Peds  Hematology  (+) Blood dyscrasia, anemia   Anesthesia Other Findings   Reproductive/Obstetrics                              Anesthesia Physical Anesthesia Plan  ASA: 2  Anesthesia Plan: General   Post-op Pain Management: Regional block*, Minimal or no pain anticipated and Tylenol PO (pre-op)*   Induction: Intravenous  PONV Risk Score and Plan: 3 and Treatment may vary due to age or medical condition, Midazolam, Ondansetron and Dexamethasone  Airway Management Planned: LMA  Additional Equipment: None  Intra-op Plan:   Post-operative Plan: Extubation in OR  Informed Consent: I have reviewed the patients History and Physical, chart, labs and discussed the procedure including the risks, benefits and alternatives for the proposed anesthesia with the patient or authorized representative who has indicated his/her understanding and acceptance.     Dental advisory given  Plan Discussed with: Anesthesiologist and CRNA  Anesthesia Plan Comments:          Anesthesia Quick Evaluation

## 2022-08-26 NOTE — Op Note (Signed)
08/26/2022  2:56 PM  PATIENT:  Christie [name]    PRE-OPERATIVE DIAGNOSIS:  Left Ankle Fracture  POST-OPERATIVE DIAGNOSIS:  Same  PROCEDURE:  OPEN REDUCTION INTERNAL FIXATION (ORIF) LEFT ANKLE FRACTURE C-arm fluoroscopy to verify reduction.  SURGEON:  [name], MD  PHYSICIAN ASSISTANT:None ANESTHESIA:   General  PREOPERATIVE INDICATIONS:  Christie [name] is a  57 y.o. female with a diagnosis of Left Ankle Fracture who failed conservative measures and elected for surgical management.    The risks benefits and alternatives were discussed with the patient preoperatively including but not limited to the risks of infection, bleeding, nerve injury, cardiopulmonary complications, the need for revision surgery, among others, and the patient was willing to proceed.  OPERATIVE IMPLANTS: 6 hole one third tubular plate  @ENCIMAGES @  OPERATIVE FINDINGS: C-arm possibly verified reduction in the AP and lateral planes with a congruent mortise.  OPERATIVE PROCEDURE: Patient is a 57 year old woman who was brought the operating room and underwent a general anesthetic.  After adequate levels anesthesia were obtained patient's left lower extremity was prepped using DuraPrep draped into a sterile field a timeout was called.  A lateral incision was made over the fibula.  Subperiosteal dissection was used to identify the fracture.  The fracture was distracted cleansed irrigated.  Fracture was reduced with a clamp a interfrag screw was placed 20 mm in length to stabilize the fibula fracture out to length.  A neutralization plate was then placed laterally secured proximally with 3 compression screws and distally with 1 compression screw.  C-arm possibly verified reduction of the mortise.  The wound was irrigated with normal saline incision was closed using 2-0 nylon sterile dressing was applied patient was extubated taken the PACU in stable condition.   DISCHARGE PLANNING:  Antibiotic  duration: Preoperative antibiotics  Weightbearing: Touchdown weightbearing on the left  Pain medication: Prescription for Vicodin  Dressing care/ Wound VAC: Dry dressing  Ambulatory devices: Crutches or kneeling scooter  Discharge to: Home.  Follow-up: In the office 1 week post operative.

## 2022-08-26 NOTE — Anesthesia Postprocedure Evaluation (Signed)
Anesthesia Post Note  Patient: Christie [name]  Procedure(s) Performed: OPEN REDUCTION INTERNAL FIXATION (ORIF) LEFT ANKLE FRACTURE (Left: Ankle)     Patient location during evaluation: PACU Anesthesia Type: General Level of consciousness: awake and alert and oriented Pain management: pain level controlled Vital Signs Assessment: post-procedure vital signs reviewed and stable Respiratory status: spontaneous breathing, nonlabored ventilation and respiratory function stable Cardiovascular status: blood pressure returned to baseline and stable Postop Assessment: no apparent nausea or vomiting Anesthetic complications: no   No notable events documented.  Last Vitals:  Vitals:   08/26/22 1515 08/26/22 1530  BP: 130/84 139/87  Pulse: (!) 54 (!) 52  Resp: 14 13  Temp:  36.6 C  SpO2: 100% 100%    Last Pain:  Vitals:   08/26/22 1530  TempSrc:   PainSc: 0-No pain                 Taejon Irani A.

## 2022-08-26 NOTE — Anesthesia Procedure Notes (Addendum)
  Anesthesia Regional Block: Popliteal block   Pre-Anesthetic Checklist: , timeout performed,  Correct Patient, Correct Site, Correct Laterality,  Correct Procedure, Correct Position, site marked,  Risks and benefits discussed,  Surgical consent,  Pre-op evaluation,  At surgeon's request and post-op pain management  Laterality: Left  Prep: chloraprep       Needles:  Injection technique: Single-shot  Needle Type: Echogenic Stimulator Needle     Needle Length: 10cm  Needle Gauge: 21   Needle insertion depth: 6 cm   Additional Needles:   Procedures:,,,, ultrasound used (permanent image in chart),,    Narrative:  Start time: 08/26/2022 12:55 PM End time: 08/26/2022 1:00 PM Injection made incrementally with aspirations every 5 mL.  Performed by: Personally  Anesthesiologist: Josephine Igo, MD  Additional Notes: Timeout performed. Patient sedated. Relevant anatomy ID'd using [place]. Incremental 2-20ml injection of LA with frequent aspiration. Patient tolerated procedure well.

## 2022-08-26 NOTE — Progress Notes (Signed)
Orthopedic Tech Progress Note Patient Details:  Christie [name] [birth year] [id number]  Ortho Devices Type of Ortho Device: Crutches Ortho Device/Splint Interventions: Ordered, Adjustment   Post Interventions Instructions Provided: Poper ambulation with device, Adjustment of device, Care of device Crutch training provided at bedside. Vernona Rieger 08/26/2022, 3:40 PM

## 2022-08-26 NOTE — Interval H&P Note (Signed)
History and Physical Interval Note:  08/26/2022 1:09 PM  Christie [name]  has presented today for surgery, with the diagnosis of Left Ankle Fracture.  The various methods of treatment have been discussed with the patient and family. After consideration of risks, benefits and other options for treatment, the patient has consented to  Procedure(s): OPEN REDUCTION INTERNAL FIXATION (ORIF) LEFT ANKLE FRACTURE (Left) as a surgical intervention.  The patient's history has been reviewed, patient examined, no change in status, stable for surgery.  I have reviewed the patient's chart and labs.  Questions were answered to the patient's satisfaction.     [name]

## 2022-08-26 NOTE — Transfer of Care (Signed)
Immediate Anesthesia Transfer of Care Note  Patient: Christie [name]  Procedure(s) Performed: OPEN REDUCTION INTERNAL FIXATION (ORIF) LEFT ANKLE FRACTURE (Left: Ankle)  Patient Location: PACU  Anesthesia Type:GA combined with regional for post-op pain  Level of Consciousness: awake, alert , oriented, and patient cooperative  Airway & Oxygen Therapy: Patient Spontanous Breathing and Patient connected to face mask oxygen  Post-op Assessment: Report given to RN and Post -op Vital signs reviewed and stable  Post vital signs: Reviewed and stable  Last Vitals:  Vitals Value Taken Time  BP 127/80 08/26/22 1456  Temp    Pulse 59 08/26/22 1459  Resp 13 08/26/22 1459  SpO2 100 % 08/26/22 1459  Vitals shown include unvalidated device data.  Last Pain:  Vitals:   08/26/22 1300  TempSrc:   PainSc: 0-No pain      Patients Stated Pain Goal: 3 ([id number])  Complications: No notable events documented.

## 2022-08-26 NOTE — Telephone Encounter (Signed)
[facility] called about xray report of ankle that was done on 3/13 is back.  Looks like she saw Dr. Sharol Given yesterday and had reduction done today.

## 2022-08-29 ENCOUNTER — Encounter (HOSPITAL_COMMUNITY): Payer: Self-pay | Admitting: Orthopedic Surgery

## 2022-08-31 ENCOUNTER — Telehealth: Payer: Self-pay | Admitting: Orthopedic Surgery

## 2022-08-31 NOTE — Telephone Encounter (Signed)
SW pt, advised that this can be normal for post op. She is informed to continue to keep foot elevated above heart level. Let her know she can unwrap the outer layer coban and rewrap as that bandage is tight. She will follow up with [place] next week.

## 2022-08-31 NOTE — Telephone Encounter (Signed)
Patient states she is having some tightness in her foot. Please advise

## 2022-09-05 ENCOUNTER — Ambulatory Visit: Payer: Federal, State, Local not specified - PPO | Admitting: Orthopedic Surgery

## 2022-09-05 ENCOUNTER — Other Ambulatory Visit (INDEPENDENT_AMBULATORY_CARE_PROVIDER_SITE_OTHER): Payer: Federal, State, Local not specified - PPO

## 2022-09-05 DIAGNOSIS — S8262XA Displaced fracture of lateral malleolus of left fibula, initial encounter for closed fracture: Secondary | ICD-10-CM | POA: Diagnosis not present

## 2022-09-18 ENCOUNTER — Encounter: Payer: Self-pay | Admitting: Orthopedic Surgery

## 2022-09-18 NOTE — Progress Notes (Signed)
Office Visit Note   Patient: Christie [name]           Date of Birth: Aug 18, 1965           MRN: [id number] Visit Date: 09/05/2022              Requested by: Sandford Craze, NP [address] [place],  [place] [address] PCP: Sandford Craze, NP  Chief Complaint  Patient presents with   Left Ankle - Routine Post Op    08/26/22 ORIF left ankle fracture      HPI: Patient is a 57 year old woman who is 10 days status post open reduction internal fixation left ankle she is not wearing her fracture boot.  Assessment & Plan: Visit Diagnoses:  1. 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  Recommend she resume using the fracture boot.  Follow-Up Instructions: Return in about 3 weeks (around 09/26/2022).   Ortho Exam  Patient is alert, oriented, no adenopathy, well-dressed, normal affect, normal respiratory effort. Incision is well-healed sutures are removed.  Imaging: No results found. No images are attached to the encounter.  Labs: Lab Results  Component Value Date   HGBA1C 6.2 03/18/2022   HGBA1C 6.1 08/17/2021     Lab Results  Component Value Date   ALBUMIN 4.5 03/18/2022   ALBUMIN 4.5 08/17/2021   ALBUMIN 3.7 09/18/2020    No results found for: "MG" No results found for: "VD25OH"  No results found for: "PREALBUMIN"    Latest Ref Rng & Units 08/26/2022   10:46 AM 09/18/2020    2:50 AM 09/17/2020   10:58 PM  CBC EXTENDED  WBC 4.0 - 10.5 K/uL 7.3  8.2  10.0   RBC 3.87 - 5.11 MIL/uL 4.43  4.00  4.08   Hemoglobin 12.0 - 15.0 g/dL 02.1  11.7  35.6   HCT 36.0 - 46.0 % 42.0  37.0  38.2   Platelets 150 - 400 K/uL 180  158  159   NEUT# 1.7 - 7.7 K/uL   6.8   Lymph# 0.7 - 4.0 K/uL   2.0      There is no height or weight on file to calculate BMI.  Orders:  Orders Placed This Encounter  Procedures   XR Ankle Complete Left    No orders of the defined types were placed in this encounter.    Procedures: No procedures performed  Clinical Data: No additional findings.  ROS:  All other systems negative, except as noted in the HPI. Review of Systems  Objective: Vital Signs: LMP  (LMP Unknown) Comment: Hx uterine ablation  Specialty Comments:  No specialty comments available.  PMFS History: Patient Active Problem List   Diagnosis Date Noted   Acute left ankle pain 08/24/2022   Closed nondisplaced fracture of lateral malleolus of left fibula 08/24/2022   Acute conjunctivitis of both eyes 09/07/2021   Hyperglycemia 08/18/2021   Multinodular goiter 01/14/2020   Eczema 07/11/2015   Tobacco abuse 09/19/2014   Preventative health care 06/01/2014   Hot flashes 05/21/2013   Epicondylitis elbow, medial 05/21/2013   Alopecia 02/25/2013   Back pain 04/03/2012   Anxiety state 06/25/2010   Allergic rhinitis 06/25/2010   Past Medical History:  Diagnosis Date   Anemia    "years ago"   Anxiety    Eczema 10/2015   HSV (herpes simplex virus) infection  Hyperglycemia    Hyperlipidemia 08/2021   no meds, diet controlled   Multinodular goiter 03/18/2022    Family History  Problem Relation Age of Onset   Hypertension Mother    Heart failure Mother    Lung cancer Mother        s/p resection   Diabetes Maternal Grandmother        type II   Hypertension Maternal Grandmother    Alcohol abuse Other        family hx   Anxiety disorder Other        family hx   Arthritis Other        family hx   Hypertension Other        family hx   Thyroid disease Other        family hx   Gallstones Brother    Hypertension Brother     Past Surgical History:  Procedure Laterality Date   ABLATION  06/13/2008   uterine   CESAREAN SECTION     x 1   COLONOSCOPY     HYSTEROSCOPY  06/13/2008   per patient   ORIF ANKLE FRACTURE Left 08/26/2022   Procedure: OPEN REDUCTION INTERNAL FIXATION (ORIF) LEFT ANKLE  FRACTURE;  Surgeon: Nadara Mustard, MD;  Location: [facility] OR;  Service: Orthopedics;  Laterality: Left;   WISDOM TOOTH EXTRACTION     Social History   Occupational History   Not on file  Tobacco Use   Smoking status: Every Day    Packs/day: .5    Types: Cigarettes   Smokeless tobacco: Never   Tobacco comments:    10 cigarettes daily  Vaping Use   Vaping Use: Some days   Substances: Nicotine, Flavoring  Substance and Sexual Activity   Alcohol use: Not Currently    Alcohol/week: 7.0 - 14.0 standard drinks of alcohol    Types: 7 - 14 Standard drinks or equivalent per week    Comment: daily 1-2 glass wine/day   Drug use: Yes    Frequency: 7.0 times per week    Types: Marijuana    Comment: Last use was on 08/25/22   Sexual activity: Yes    Birth control/protection: Other-see comments    Comment: uterine ablation

## 2022-09-27 ENCOUNTER — Ambulatory Visit (INDEPENDENT_AMBULATORY_CARE_PROVIDER_SITE_OTHER): Payer: Federal, State, Local not specified - PPO | Admitting: Orthopedic Surgery

## 2022-09-27 DIAGNOSIS — S8262XA Displaced fracture of lateral malleolus of left fibula, initial encounter for closed fracture: Secondary | ICD-10-CM

## 2022-10-02 ENCOUNTER — Encounter: Payer: Self-pay | Admitting: Orthopedic Surgery

## 2022-10-02 NOTE — Progress Notes (Signed)
Office Visit Note   Patient: Christie [name]           Date of Birth: 20-Aug-1965           MRN: [id number] Visit Date: 09/27/2022              Requested by: Sandford Craze, NP [address] [place],  [place] [address] PCP: Sandford Craze, NP  Chief Complaint  Patient presents with   Left Ankle - Routine Post Op    08/26/22 ORIF left ankle fracture      HPI: Patient is a 57 year old woman is 1 month status post open reduction internal fixation left ankle fracture.  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  She is in a fracture boot.  Assessment & Plan: Visit Diagnoses:  1. Closed displaced fracture of lateral malleolus of left fibula, initial encounter     Plan: Will advance to an ASO increase her activities as tolerated.  Follow-Up Instructions: No follow-ups on file.   Ortho Exam  Patient is alert, oriented, no adenopathy, well-dressed, normal affect, normal respiratory effort. Examination patient has good range of motion of her ankle the incision is well-healed she is asymptomatic.  Imaging: No results found. No images are attached to the encounter.  Labs: Lab Results  Component Value Date   HGBA1C 6.2 03/18/2022   HGBA1C 6.1 08/17/2021     Lab Results  Component Value Date   ALBUMIN 4.5 03/18/2022   ALBUMIN 4.5 08/17/2021   ALBUMIN 3.7 09/18/2020    No results found for: "MG" No results found for: "VD25OH"  No results found for: "PREALBUMIN"    Latest Ref Rng & Units 08/26/2022   10:46 AM 09/18/2020    2:50 AM 09/17/2020   10:58 PM  CBC EXTENDED  WBC 4.0 - 10.5 K/uL 7.3  8.2  10.0   RBC 3.87 - 5.11 MIL/uL 4.43  4.00  4.08   Hemoglobin 12.0 - 15.0 g/dL 01.0  27.2  53.6   HCT 36.0 - 46.0 % 42.0  37.0  38.2   Platelets 150 - 400 K/uL 180  158  159   NEUT# 1.7 - 7.7 K/uL   6.8   Lymph# 0.7 - 4.0 K/uL   2.0      There is no height or weight on file to calculate BMI.  Orders:  No orders of the  defined types were placed in this encounter.  No orders of the defined types were placed in this encounter.    Procedures: No procedures performed  Clinical Data: No additional findings.  ROS:  All other systems negative, except as noted in the HPI. Review of Systems  Objective: Vital Signs: LMP  (LMP Unknown) Comment: Hx uterine ablation  Specialty Comments:  No specialty comments available.  PMFS History: Patient Active Problem List   Diagnosis Date Noted   Acute left ankle pain 08/24/2022   Closed nondisplaced fracture of lateral malleolus of left fibula 08/24/2022   Acute conjunctivitis of both eyes 09/07/2021   Hyperglycemia 08/18/2021   Multinodular goiter 01/14/2020   Eczema 07/11/2015   Tobacco abuse 09/19/2014   Preventative health care 06/01/2014   Hot flashes 05/21/2013   Epicondylitis elbow, medial 05/21/2013   Alopecia 02/25/2013   Back pain 04/03/2012   Anxiety state 06/25/2010   Allergic rhinitis 06/25/2010   Past Medical History:  Diagnosis Date   Anemia    "years ago"   Anxiety    Eczema  10/2015   HSV (herpes simplex virus) infection    Hyperglycemia    Hyperlipidemia 08/2021   no meds, diet controlled   Multinodular goiter 03/18/2022    Family History  Problem Relation Age of Onset   Hypertension Mother    Heart failure Mother    Lung cancer Mother        s/p resection   Diabetes Maternal Grandmother        type II   Hypertension Maternal Grandmother    Alcohol abuse Other        family hx   Anxiety disorder Other        family hx   Arthritis Other        family hx   Hypertension Other        family hx   Thyroid disease Other        family hx   Gallstones Brother    Hypertension Brother     Past Surgical History:  Procedure Laterality Date   ABLATION  06/13/2008   uterine   CESAREAN SECTION     x 1   COLONOSCOPY     HYSTEROSCOPY  06/13/2008   per patient   ORIF ANKLE FRACTURE Left 08/26/2022   Procedure: OPEN  REDUCTION INTERNAL FIXATION (ORIF) LEFT ANKLE FRACTURE;  Surgeon: Nadara Mustard, MD;  Location: [facility] OR;  Service: Orthopedics;  Laterality: Left;   WISDOM TOOTH EXTRACTION     Social History   Occupational History   Not on file  Tobacco Use   Smoking status: Every Day    Packs/day: .5    Types: Cigarettes   Smokeless tobacco: Never   Tobacco comments:    10 cigarettes daily  Vaping Use   Vaping Use: Some days   Substances: Nicotine, Flavoring  Substance and Sexual Activity   Alcohol use: Not Currently    Alcohol/week: 7.0 - 14.0 standard drinks of alcohol    Types: 7 - 14 Standard drinks or equivalent per week    Comment: daily 1-2 glass wine/day   Drug use: Yes    Frequency: 7.0 times per week    Types: Marijuana    Comment: Last use was on 08/25/22   Sexual activity: Yes    Birth control/protection: Other-see comments    Comment: uterine ablation

## 2022-10-04 DIAGNOSIS — H16143 Punctate keratitis, bilateral: Secondary | ICD-10-CM | POA: Diagnosis not present

## 2022-10-14 ENCOUNTER — Telehealth: Payer: Self-pay | Admitting: Family

## 2022-10-14 ENCOUNTER — Encounter: Payer: Self-pay | Admitting: Family

## 2022-10-14 ENCOUNTER — Other Ambulatory Visit: Payer: Self-pay | Admitting: Family

## 2022-10-14 NOTE — Telephone Encounter (Signed)
Pt called to see if her provider can write a letter stating her 58 year old dog is a Metallurgist for stress relief. She said that she cannot have a pet in the apartment she is moving into unless she has documentation. Pt is okay with sending letter via mychart. Please call her if there are any questions or if we are unable to write letter.

## 2022-10-14 NOTE — Telephone Encounter (Signed)
This has been taken care of.

## 2022-10-15 ENCOUNTER — Other Ambulatory Visit: Payer: Self-pay | Admitting: Family

## 2022-12-03 DIAGNOSIS — H40033 Anatomical narrow angle, bilateral: Secondary | ICD-10-CM | POA: Diagnosis not present

## 2022-12-03 DIAGNOSIS — H2513 Age-related nuclear cataract, bilateral: Secondary | ICD-10-CM | POA: Diagnosis not present

## 2022-12-26 ENCOUNTER — Ambulatory Visit
Admission: EM | Admit: 2022-12-26 | Discharge: 2022-12-26 | Disposition: A | Discharge status: Home / Self Care | Payer: Federal, State, Local not specified - PPO | Attending: Urgent Care | Admitting: Urgent Care

## 2022-12-26 ENCOUNTER — Ambulatory Visit (INDEPENDENT_AMBULATORY_CARE_PROVIDER_SITE_OTHER): Payer: Federal, State, Local not specified - PPO

## 2022-12-26 DIAGNOSIS — R058 Other specified cough: Secondary | ICD-10-CM | POA: Diagnosis not present

## 2022-12-26 DIAGNOSIS — R52 Pain, unspecified: Secondary | ICD-10-CM

## 2022-12-26 DIAGNOSIS — R509 Fever, unspecified: Secondary | ICD-10-CM | POA: Diagnosis not present

## 2022-12-26 DIAGNOSIS — J029 Acute pharyngitis, unspecified: Secondary | ICD-10-CM | POA: Diagnosis not present

## 2022-12-26 DIAGNOSIS — J069 Acute upper respiratory infection, unspecified: Secondary | ICD-10-CM

## 2022-12-26 DIAGNOSIS — U071 COVID-19: Secondary | ICD-10-CM | POA: Diagnosis not present

## 2022-12-26 DIAGNOSIS — F17210 Nicotine dependence, cigarettes, uncomplicated: Secondary | ICD-10-CM | POA: Insufficient documentation

## 2022-12-26 DIAGNOSIS — J849 Interstitial pulmonary disease, unspecified: Secondary | ICD-10-CM | POA: Diagnosis not present

## 2022-12-26 DIAGNOSIS — R918 Other nonspecific abnormal finding of lung field: Secondary | ICD-10-CM | POA: Diagnosis not present

## 2022-12-26 LAB — POCT RAPID STREP A (OFFICE): Rapid Strep A Screen: NEGATIVE

## 2022-12-26 MED ORDER — AMOXICILLIN-POT CLAVULANATE 875-125 MG PO TABS: 1.0000 | ORAL_TABLET | Freq: Two times a day (BID) | ORAL | 0 refills | Status: AC

## 2022-12-26 MED ORDER — AZITHROMYCIN 250 MG PO TABS: 250.0000 mg | ORAL_TABLET | Freq: Every day | ORAL | 0 refills | Status: DC

## 2022-12-26 NOTE — ED Triage Notes (Signed)
Pt reports weakness,back pain,cough, congestion,ear pain, chills, drainage and throat pain when coughing x 4 days. Mucinex gives no relief.

## 2022-12-26 NOTE — ED Provider Notes (Signed)
[facility]    CSN: [id number] Arrival date & time: 12/26/22  1016      History   Chief Complaint No chief complaint on file.   HPI Christie [name] is a 57 y.o. female.   Pleasant 57 year old female presents today due to concerns of sore throat, ear pressure, headache, congestion, and bodyaches primarily in the back since Thursday of last week.  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  She did not wear her mask or use her hand sanitizer like she typically does.  That evening, she started feeling unwell.  Her main concern is sore throat.  She feels like she has had a fever, but did not check it at home.  She has been using over-the-counter Mucinex and Tylenol. Pt does smoke. Does have a cough as well for the past 5 days, was previously yellow, then changed to green and now gray. Denies any wheezing. Wearing nicotine patch as her throat pain is preventing her from smoking.     Past Medical History:  Diagnosis Date   Anemia    "years ago"   Anxiety    Eczema 10/2015   HSV (herpes simplex virus) infection    Hyperglycemia    Hyperlipidemia 08/2021   no meds, diet controlled   Multinodular goiter 03/18/2022    Patient Active Problem List   Diagnosis Date Noted   Acute left ankle pain 08/24/2022   Closed nondisplaced fracture of lateral malleolus of left fibula 08/24/2022   Acute conjunctivitis of both eyes 09/07/2021   Hyperglycemia 08/18/2021   Multinodular goiter 01/14/2020   Eczema 07/11/2015   Tobacco abuse 09/19/2014   Preventative health care 06/01/2014   Hot flashes 05/21/2013   Epicondylitis elbow, medial 05/21/2013   Alopecia 02/25/2013   Back pain 04/03/2012   Anxiety state 06/25/2010   Allergic rhinitis 06/25/2010    Past Surgical History:  Procedure Laterality Date   ABLATION  06/13/2008   uterine   CESAREAN SECTION     x 1   COLONOSCOPY     HYSTEROSCOPY  06/13/2008   per patient   ORIF ANKLE FRACTURE  Left 08/26/2022   Procedure: OPEN REDUCTION INTERNAL FIXATION (ORIF) LEFT ANKLE FRACTURE;  Surgeon: Nadara Mustard, MD;  Location: [facility] OR;  Service: Orthopedics;  Laterality: Left;   WISDOM TOOTH EXTRACTION      OB History   No obstetric history on file.      Home Medications    Prior to Admission medications   Medication Sig Start Date End Date Taking? Authorizing Provider  amoxicillin-clavulanate (AUGMENTIN) 875-125 MG tablet Take 1 tablet by mouth 2 (two) times daily with a meal for 7 days. 12/26/22 01/02/23 Yes Ia Leeb L, PA  azithromycin (ZITHROMAX) 250 MG tablet Take 1 tablet (250 mg total) by mouth daily. Take first 2 tablets together, then 1 every day until finished. 12/26/22  Yes Tiwan Schnitker L, PA  dextromethorphan-guaiFENesin (MUCINEX DM) 30-600 MG 12hr tablet Take 1 tablet by mouth 2 (two) times daily.   Yes [provider]  CALCIUM-VITAMIN D PO Take 1 capsule by mouth daily.    [provider]  cetirizine (ZYRTEC) 10 MG tablet Take 1 tablet (10 mg total) by mouth daily. 11/15/19   Sandford Craze, NP  citalopram (CELEXA) 20 MG tablet TAKE 1 AND 1/2 TABLETS BY MOUTH DAILY 10/14/22   Sandford Craze, NP  EYSUVIS 0.25 % SUSP Place 1 drop into both eyes daily. 05/08/20   [provider]  fluticasone (FLONASE) 50 MCG/ACT nasal spray Place 2 sprays into both nostrils daily as needed for allergies. 08/24/22   Sandford Craze, NP  gabapentin (NEURONTIN) 300 MG capsule TAKE 1 CAPSULE BY MOUTH EVERYDAY AT BEDTIME 10/16/22   Sandford Craze, NP  nicotine (NICODERM CQ - DOSED IN MG/24 HOURS) 21 mg/24hr patch Place 1 patch (21 mg total) onto the skin daily. 08/26/22 08/26/23  Nadara Mustard, MD  varenicline (CHANTIX PAK) 0.5 MG X 11 & 1 MG X 42 tablet Take one 0.5 mg tablet by mouth once daily for 3 days, then increase to one 0.5 mg tablet twice daily for 4 days, then increase to one 1 mg tablet twice daily. 08/17/21   Sandford Craze, NP    Family  History Family History  Problem Relation Age of Onset   Hypertension Mother    Heart failure Mother    Lung cancer Mother        s/p resection   Diabetes Maternal Grandmother        type II   Hypertension Maternal Grandmother    Alcohol abuse Other        family hx   Anxiety disorder Other        family hx   Arthritis Other        family hx   Hypertension Other        family hx   Thyroid disease Other        family hx   Gallstones Brother    Hypertension Brother     Social History Social History   Tobacco Use   Smoking status: Every Day    Current packs/day: 0.50    Types: Cigarettes   Smokeless tobacco: Former   Tobacco comments:    10 cigarettes daily  Vaping Use   Vaping status: Some Days   Substances: Nicotine, Flavoring  Substance Use Topics   Alcohol use: Not Currently    Alcohol/week: 7.0 - 14.0 standard drinks of alcohol    Types: 7 - 14 Standard drinks or equivalent per week    Comment: daily 1-2 glass wine/day   Drug use: Yes    Frequency: 7.0 times per week    Types: Marijuana     Allergies   Patient has no known allergies.   Review of Systems Review of Systems As per HPI  Physical Exam Triage Vital Signs ED Triage Vitals  Encounter Vitals Group     BP 12/26/22 1110 (!) 143/98     Systolic BP Percentile --      Diastolic BP Percentile --      Pulse Rate 12/26/22 1110 81     Resp 12/26/22 1110 18     Temp 12/26/22 1110 99.5 F (37.5 C)     Temp Source 12/26/22 1110 Oral     SpO2 12/26/22 1110 96 %     Weight --      Height --      Head Circumference --      Peak Flow --      Pain Score 12/26/22 1114 0     Pain Loc --      Pain Education --      Exclude from Growth Chart --    No data found.  Updated Vital Signs BP (!) 143/98 (BP Location: Right Arm)   Pulse 81   Temp 99.5 F (37.5 C) (Oral)   Resp 18   LMP  (LMP Unknown) Comment: Hx uterine ablation  SpO2  96%   Visual Acuity Right Eye Distance:   Left Eye Distance:    Bilateral Distance:    Right Eye Near:   Left Eye Near:    Bilateral Near:     Physical Exam Vitals and nursing note reviewed.  Constitutional:      General: She is not in acute distress.    Appearance: Normal appearance. She is well-developed. She is not ill-appearing or toxic-appearing.  HENT:     Head: Normocephalic and atraumatic.     Right Ear: Tympanic membrane, ear canal and external ear normal. There is no impacted cerumen.     Left Ear: Tympanic membrane, ear canal and external ear normal. There is no impacted cerumen.     Nose: Nose normal. No congestion or rhinorrhea.     Mouth/Throat:     Mouth: Mucous membranes are moist.     Pharynx: Oropharynx is clear. Posterior oropharyngeal erythema present. No oropharyngeal exudate.  Eyes:     General: No scleral icterus.       Right eye: No discharge.        Left eye: No discharge.     Extraocular Movements: Extraocular movements intact.     Conjunctiva/sclera: Conjunctivae normal.     Pupils: Pupils are equal, round, and reactive to light.  Cardiovascular:     Rate and Rhythm: Normal rate and regular rhythm.     Heart sounds: No murmur heard. Pulmonary:     Effort: Pulmonary effort is normal. No accessory muscle usage, respiratory distress or retractions.     Breath sounds: Normal breath sounds and air entry. No stridor, decreased air movement or transmitted upper airway sounds. No decreased breath sounds, wheezing, rhonchi or rales.  Chest:     Chest wall: Tenderness (posterior - lower back region) present.  Abdominal:     Palpations: Abdomen is soft.     Tenderness: There is no abdominal tenderness.  Musculoskeletal:        General: Tenderness (lower back) present. No swelling.     Cervical back: Normal range of motion and neck supple. No rigidity or tenderness.  Lymphadenopathy:     Cervical: No cervical adenopathy.  Skin:    General: Skin is warm and dry.     Capillary Refill: Capillary refill takes less than 2  seconds.     Findings: No erythema or rash.  Neurological:     General: No focal deficit present.     Mental Status: She is alert and oriented to person, place, and time.     Sensory: No sensory deficit.     Motor: No weakness.  Psychiatric:        Mood and Affect: Mood normal.      UC Treatments / Results  Labs (all labs ordered are listed, but only abnormal results are displayed) Labs Reviewed  SARS CORONAVIRUS 2 (TAT 6-24 HRS)  POCT RAPID STREP A (OFFICE)    EKG   Radiology DG Chest 2 View  Result Date: 12/26/2022 CLINICAL DATA:  Fever, productive cough EXAM: CHEST - 2 VIEW COMPARISON:  None Available. FINDINGS: The heart size and mediastinal contours are within normal limits. Diffuse bilateral interstitial pulmonary opacity. The visualized skeletal structures are unremarkable. IMPRESSION: Diffuse bilateral interstitial pulmonary opacity consistent with edema or infection. No focal airspace opacity. Electronically Signed   By: Jearld Lesch M.D.   On: 12/26/2022 12:03    Procedures Procedures (including critical care time)  Medications Ordered in UC Medications - No data to display  Initial Impression / Assessment and Plan / UC Course  I have reviewed the triage vital signs and the nursing notes.  Pertinent labs & imaging results that were available during my care of the patient were reviewed by me and considered in my medical decision making (see chart for details).     URI sx  - covid test obtained, pending results. Body aches - today is day 5, past tx window for flu therefore testing not obtained Sore throat - strep test negative. Interstitial pneumonia - CXR concerning for B interstitial infiltrates. Will start azithro and augmentin to cover for CAP. Pt will f/u with PCP in 1 month to determine if repeat imaging indicated. RTC if new sx develop. Mucinex BID   Final Clinical Impressions(s) / UC Diagnoses   Final diagnoses:  Viral URI  Body aches  Acute sore  throat  Pneumonia, interstitial (HCC)     Discharge Instructions      Your strep test is negative.  Your COVID test is pending, we will call with the results once obtained. Your chest x-ray is concerning for a bilateral pneumonia. Please start taking 2 antibiotics. The first 1 is Augmentin, take twice daily with a meal until gone. The second 1 is azithromycin, take 2 today, followed by 1 tablet daily for the next 4 days. Please try not to suppress the cough, we would encourage you to take Mucinex 600 mg twice daily to help remove the phlegm from your chest. Steam from a hot shower or warm compresses to your chest may also help. Please follow up with your PCP in 1 month for a possible repeat chest xray to ensure complete resolution.     ED Prescriptions     Medication Sig Dispense Auth. Provider   azithromycin (ZITHROMAX) 250 MG tablet Take 1 tablet (250 mg total) by mouth daily. Take first 2 tablets together, then 1 every day until finished. 6 tablet Bader Stubblefield L, PA   amoxicillin-clavulanate (AUGMENTIN) 875-125 MG tablet Take 1 tablet by mouth 2 (two) times daily with a meal for 7 days. 14 tablet Aily Tzeng L, [place]      PDMP not reviewed this encounter.   Maretta Bees, [place] 12/26/22 1215

## 2022-12-26 NOTE — Discharge Instructions (Addendum)
Your strep test is negative.  Your COVID test is pending, we will call with the results once obtained. Your chest x-ray is concerning for a bilateral pneumonia. Please start taking 2 antibiotics. The first 1 is Augmentin, take twice daily with a meal until gone. The second 1 is azithromycin, take 2 today, followed by 1 tablet daily for the next 4 days. Please try not to suppress the cough, we would encourage you to take Mucinex 600 mg twice daily to help remove the phlegm from your chest. Steam from a hot shower or warm compresses to your chest may also help. Please follow up with your PCP in 1 month for a possible repeat chest xray to ensure complete resolution.

## 2022-12-27 LAB — SARS CORONAVIRUS 2 (TAT 6-24 HRS): SARS Coronavirus 2: POSITIVE — AB

## 2023-01-03 ENCOUNTER — Ambulatory Visit: Payer: Federal, State, Local not specified - PPO | Admitting: Family

## 2023-01-03 VITALS — BP 135/81 | HR 55 | Temp 98.6°F | Resp 16 | Wt 123.0 lb

## 2023-01-03 DIAGNOSIS — J189 Pneumonia, unspecified organism: Secondary | ICD-10-CM | POA: Diagnosis not present

## 2023-01-03 NOTE — Progress Notes (Unsigned)
Subjective:     Patient ID: Christie [name], female    DOB: 1966-05-27, 57 y.o.   MRN: [id number]  No chief complaint on file.   HPI  Discussed the use of AI scribe software for clinical note transcription with the patient, who gave verbal consent to proceed.  History of Present Illness         Patient is a 57 yr old female who presents today for follow up of her pneumonia  She visited the Urgent Care on 12/26/22 with acute viral symptoms. She tested positive for Covid 19. CXR at that time noted diffuse bilateral interstitial pulmonary opacities.  She was placed on azithromycin and augmentin.  She has one day left of the Augmentin. Overall, she is improving but remains extremely fatigued.  She does not feel ready to return to work.    Health Maintenance Due  Topic Date Due   COVID-19 Vaccine (4 - 2023-24 season) 02/11/2022   MAMMOGRAM  08/04/2022    Past Medical History:  Diagnosis Date   Anemia    "years ago"   Anxiety    Eczema 10/2015   HSV (herpes simplex virus) infection    Hyperglycemia    Hyperlipidemia 08/2021   no meds, diet controlled   Multinodular goiter 03/18/2022    Past Surgical History:  Procedure Laterality Date   ABLATION  06/13/2008   uterine   CESAREAN SECTION     x 1   COLONOSCOPY     HYSTEROSCOPY  06/13/2008   per patient   ORIF ANKLE FRACTURE Left 08/26/2022   Procedure: OPEN REDUCTION INTERNAL FIXATION (ORIF) LEFT ANKLE FRACTURE;  Surgeon: Nadara Mustard, MD;  Location: [facility] OR;  Service: Orthopedics;  Laterality: Left;   WISDOM TOOTH EXTRACTION      Family History  Problem Relation Age of Onset   Hypertension Mother    Heart failure Mother    Lung cancer Mother        s/p resection   Diabetes Maternal Grandmother        type II   Hypertension Maternal Grandmother    Alcohol abuse Other        family hx   Anxiety disorder Other        family hx   Arthritis Other        family hx   Hypertension Other        family hx    Thyroid disease Other        family hx   Gallstones Brother    Hypertension Brother     Social History   Socioeconomic History   Marital status: Married    Spouse name: Not on file   Number of children: Not on file   Years of education: Not on file   Highest education level: Not on file  Occupational History   Not on file  Tobacco Use   Smoking status: Every Day    Current packs/day: 0.50    Types: Cigarettes   Smokeless tobacco: Former   Tobacco comments:    10 cigarettes daily  Vaping Use   Vaping status: Some Days   Substances: Nicotine, Flavoring  Substance and Sexual Activity   Alcohol use: Not Currently    Alcohol/week: 7.0 - 14.0 standard drinks of alcohol    Types: 7 - 14 Standard drinks or equivalent per week    Comment: daily 1-2 glass wine/day   Drug use: Yes    Frequency: 7.0 times per week  Types: Marijuana   Sexual activity: Yes    Birth control/protection: Other-see comments    Comment: uterine ablation  Other Topics Concern   Not on file  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      Social Determinants of Health   Financial Resource Strain: Not on file  Food Insecurity: Not on file  Transportation Needs: Not on file  Physical Activity: Not on file  Stress: Not on file  Social Connections: Not on file  Intimate Partner Violence: Not on file    Outpatient Medications Prior to Visit  Medication Sig Dispense Refill   azithromycin (ZITHROMAX) 250 MG tablet Take 1 tablet (250 mg total) by mouth daily. Take first 2 tablets together, then 1 every day until finished. 6 tablet 0   CALCIUM-VITAMIN D PO Take 1 capsule by mouth daily.     cetirizine (ZYRTEC) 10 MG tablet Take 1 tablet (10 mg total) by mouth daily. 30 tablet 11   citalopram (CELEXA) 20 MG tablet TAKE 1 AND 1/2 TABLETS BY MOUTH DAILY 135 tablet 1    dextromethorphan-guaiFENesin (MUCINEX DM) 30-600 MG 12hr tablet Take 1 tablet by mouth 2 (two) times daily.     EYSUVIS 0.25 % SUSP Place 1 drop into both eyes daily.     fluticasone (FLONASE) 50 MCG/ACT nasal spray Place 2 sprays into both nostrils daily as needed for allergies. 9.9 mL 5   gabapentin (NEURONTIN) 300 MG capsule TAKE 1 CAPSULE BY MOUTH EVERYDAY AT BEDTIME 90 capsule 1   nicotine (NICODERM CQ - DOSED IN MG/24 HOURS) 21 mg/24hr patch Place 1 patch (21 mg total) onto the skin daily. 30 patch 1   varenicline (CHANTIX PAK) 0.5 MG X 11 & 1 MG X 42 tablet Take one 0.5 mg tablet by mouth once daily for 3 days, then increase to one 0.5 mg tablet twice daily for 4 days, then increase to one 1 mg tablet twice daily. 53 tablet 0   No facility-administered medications prior to visit.    No Known Allergies  ROS See HPI    Objective:    Physical Exam Constitutional:      General: She is not in acute distress.    Appearance: Normal appearance. She is well-developed.  HENT:     Head: Normocephalic and atraumatic.     Right Ear: Tympanic membrane, ear canal and external ear normal.     Left Ear: Tympanic membrane, ear canal and external ear normal.  Eyes:     General: No scleral icterus. Neck:     Thyroid: No thyromegaly.  Cardiovascular:     Rate and Rhythm: Normal rate and regular rhythm.     Heart sounds: Normal heart sounds. No murmur heard. Pulmonary:     Effort: Pulmonary effort is normal. No respiratory distress.     Breath sounds: Normal breath sounds. No wheezing.  Musculoskeletal:     Cervical back: Neck supple.  Skin:    General: Skin is warm and dry.  Neurological:     Mental Status: She is alert and oriented to person, place, and time.  Psychiatric:        Mood and Affect: Mood normal.        Behavior: Behavior normal.        Thought Content: Thought content normal.        Judgment: Judgment normal.  BP 135/81 (BP Location: Right Arm, Patient Position:  Sitting, Cuff Size: Small)   Pulse (!) 55   Temp 98.6 F (37 C) (Oral)   Resp 16   Wt 123 lb (55.8 kg)   LMP  (LMP Unknown) Comment: Hx uterine ablation  SpO2 100%   BMI 21.11 kg/m  Wt Readings from Last 3 Encounters:  01/03/23 123 lb (55.8 kg)  08/26/22 128 lb (58.1 kg)  08/24/22 128 lb 3.2 oz (58.2 kg)       Assessment & Plan:   Problem List Items Addressed This Visit       Unprioritized   Community acquired pneumonia - Primary    She has nearly completed her antibiotic course.  She has a normal lung exam, is afebrile and oxygen is 100% on room air.  Advised pt to continue rest, hydration, supportive measures.  Let us know if her symptoms do not continue to improve.  Plan a CXR in 1 month to ensure resolution.       Relevant Orders   DG Chest 2 View     I am having Christie [name] maintain her CALCIUM-VITAMIN D PO, cetirizine, Eysuvis, varenicline, fluticasone, nicotine, citalopram, gabapentin, dextromethorphan-guaiFENesin, and azithromycin.  No orders of the defined types were placed in this encounter.

## 2023-01-05 ENCOUNTER — Telehealth: Payer: Self-pay | Admitting: Family

## 2023-01-05 DIAGNOSIS — J189 Pneumonia, unspecified organism: Secondary | ICD-10-CM | POA: Insufficient documentation

## 2023-01-05 NOTE — Telephone Encounter (Signed)
Please advise pt that I would like her to complete a follow up chest x-ray around 01/26/23 to make sure that her lungs have returned to normal following her pneumonia treatment. I have placed order, she can mark her calendar and walk into imaging during regular business hours to complete.

## 2023-01-05 NOTE — Assessment & Plan Note (Addendum)
She has nearly completed her antibiotic course.  Advised pt to complete the course. She has a normal lung exam, is afebrile and oxygen is 100% on room air.  Advised pt to continue rest, hydration, supportive measures.  Let us know if her symptoms do not continue to improve.  Plan a CXR in 1 month to ensure resolution. Out of work note provided.

## 2023-01-05 NOTE — Telephone Encounter (Signed)
Pt notified and she will mark it on her calendar.

## 2023-01-23 ENCOUNTER — Other Ambulatory Visit: Payer: Self-pay | Admitting: Family

## 2023-01-26 ENCOUNTER — Ambulatory Visit (HOSPITAL_BASED_OUTPATIENT_CLINIC_OR_DEPARTMENT_OTHER)
Admission: RE | Admit: 2023-01-26 | Discharge: 2023-01-26 | Disposition: A | Discharge status: Home / Self Care | Payer: Federal, State, Local not specified - PPO | Source: Ambulatory Visit | Attending: Family | Admitting: Family

## 2023-01-26 DIAGNOSIS — J189 Pneumonia, unspecified organism: Secondary | ICD-10-CM | POA: Insufficient documentation

## 2023-02-04 IMAGING — US US THYROID
1 series · 13 of 25 positions shown · non-contrast
Comparison: Prior thyroid ultrasound 12/13/2019

CLINICAL DATA: Goiter. History of left-sided thyroid nodule
previously biopsied on 12/24/2019 with benign pathologic results.

EXAM:
THYROID ULTRASOUND
TECHNIQUE: Ultrasound examination of the thyroid gland and adjacent soft
tissues was performed.

[Series 1: us thyroid · 13 of 73 slices shown]
[im 1/73]
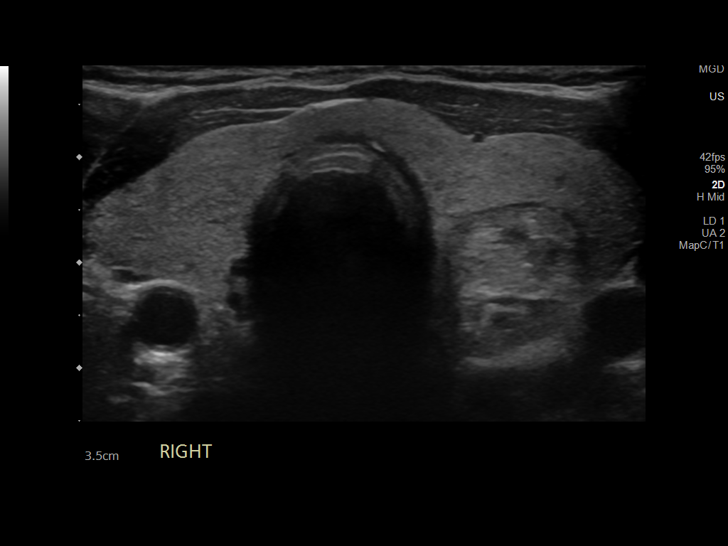
[im 7/73]
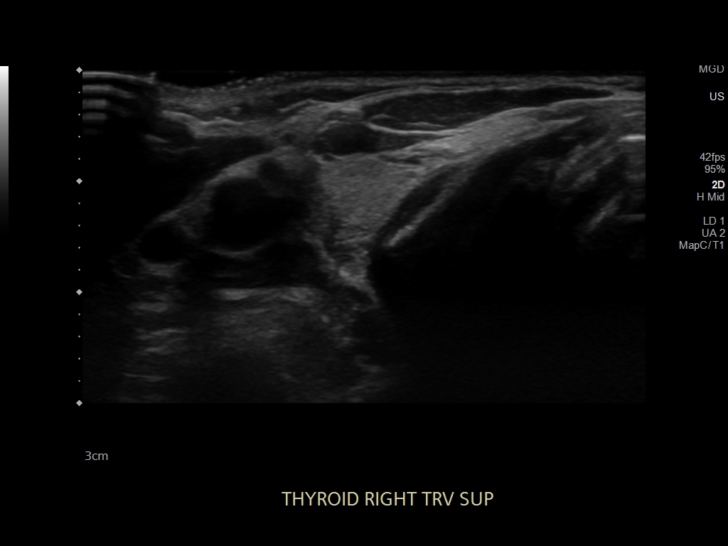
[im 13/73]
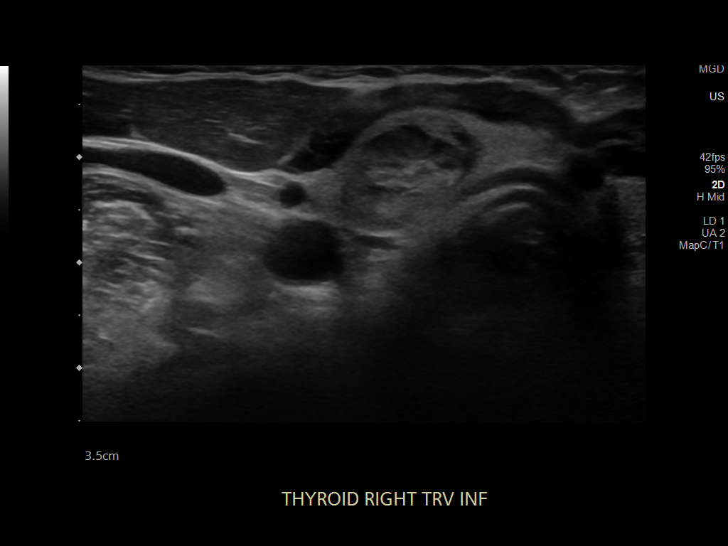
[im 19/73]
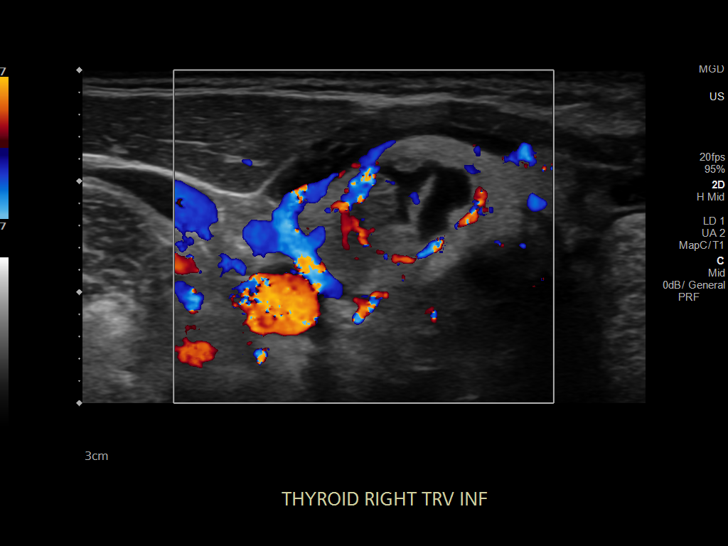
[im 25/73]
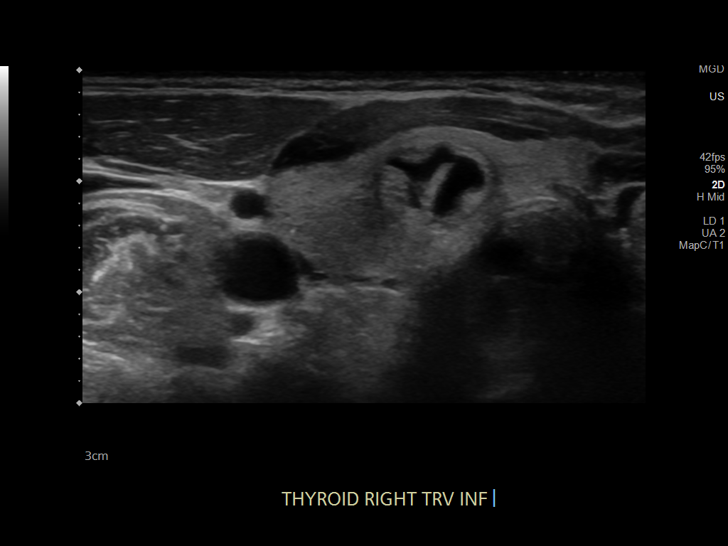
[im 31/73]
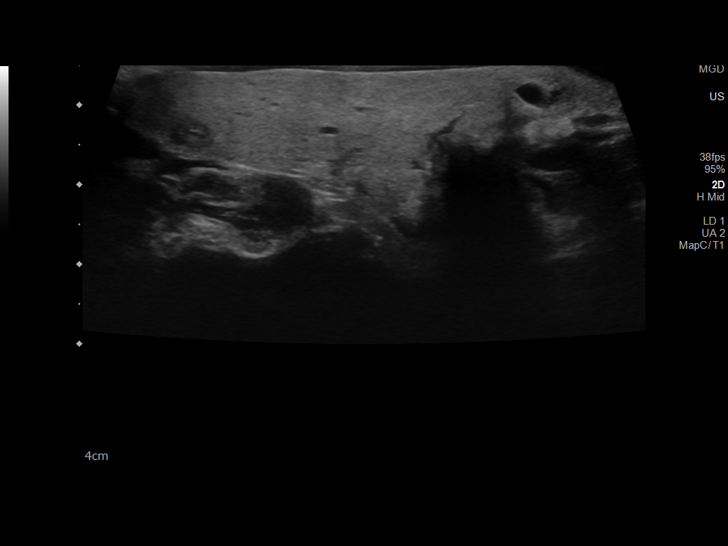
[im 37/73]
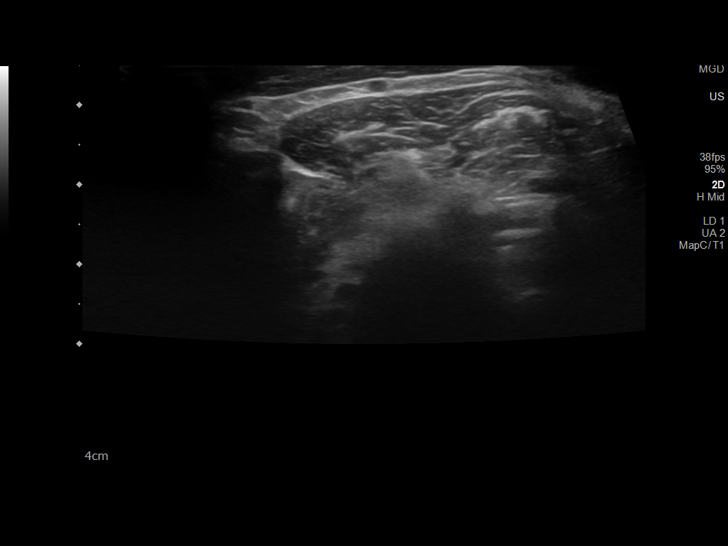
[im 43/73]
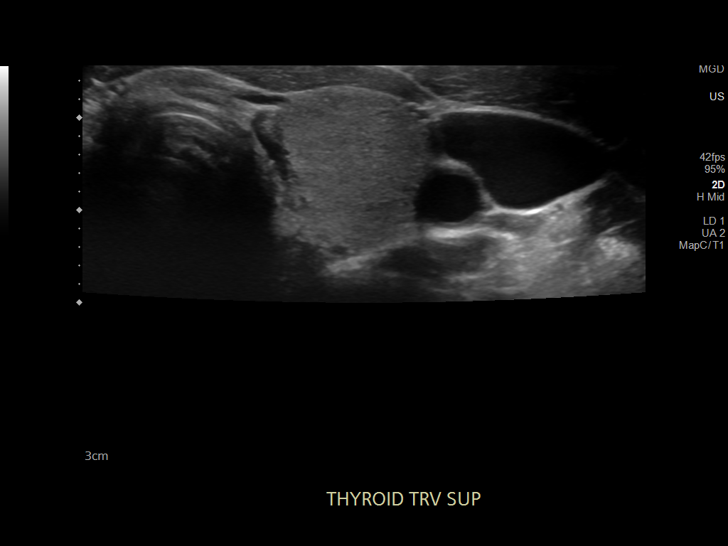
[im 49/73]
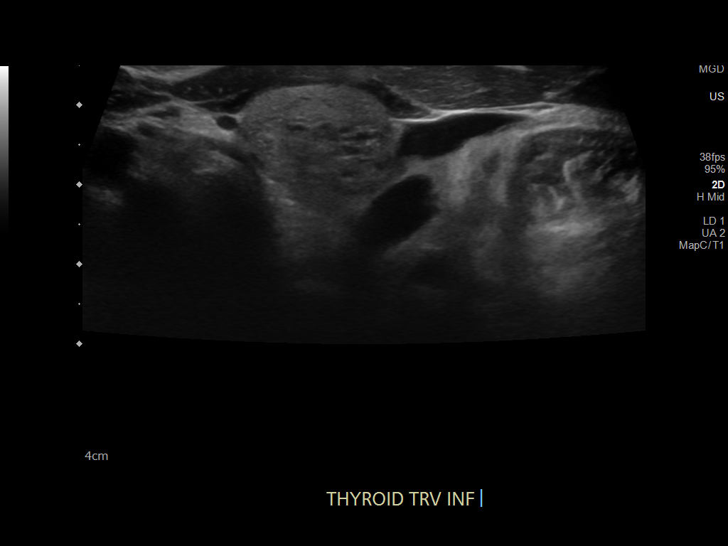
[im 55/73]
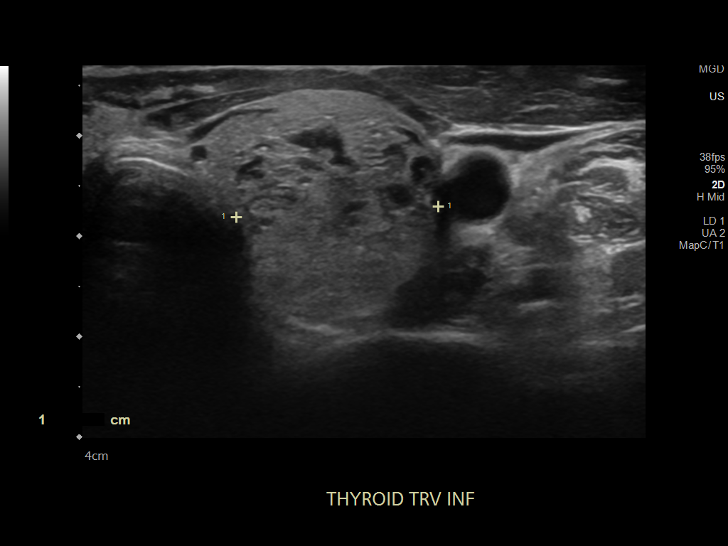
[im 61/73]
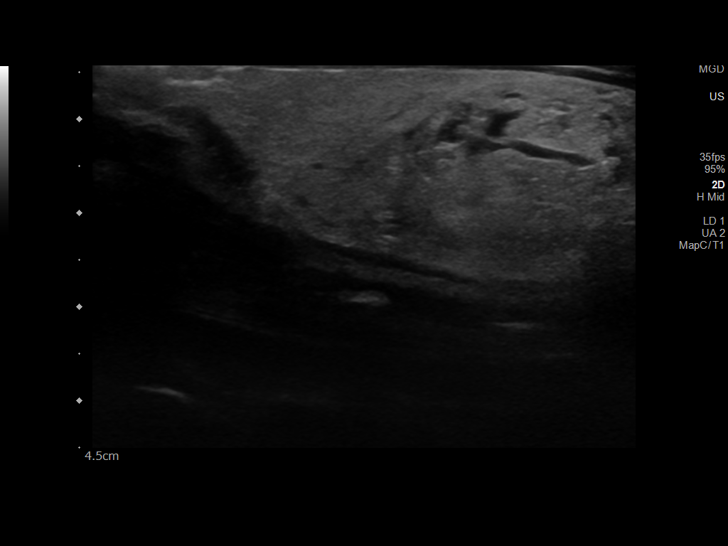
[im 67/73]
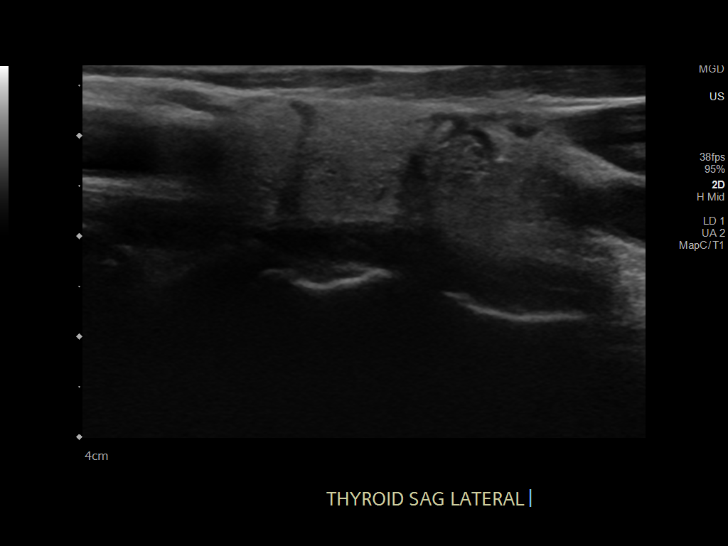
[im 73/73]
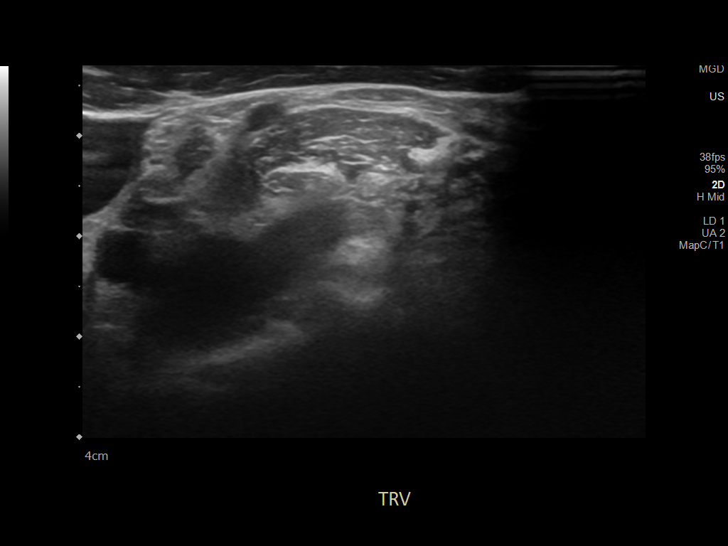

[13 of 25 positions shown; findings below may reference images not displayed]

FINDINGS: Parenchymal Echotexture: Mildly heterogenous

Isthmus: 0.2 cm

Right lobe: 5.7 x 2.4 x 2.0 cm

Left lobe: 5.5 x 1.9 x 2.0 cm

_________________________________________________________

Estimated total number of nodules >/= 1 cm: 2

Number of spongiform nodules >/=  2 cm not described below (TR1): 0

Number of mixed cystic and solid nodules >/= 1.5 cm not described
below (TR2): 0

_________________________________________________________

Nodule # 1: Mixed cystic and solid nodule in the right inferior
gland is slightly decreasing in size at 1.4 x 1.0 x 1.2 cm. This
nodule does not meet criteria for further evaluation or biopsy.

Nodule # 2: Previously biopsied nodule in the left inferior gland
measures 2.0 x 2.0 x 3.1 cm, insignificantly changed compared to
prior.

No new nodules or suspicious features.
IMPRESSION: 1. No significant interval change in the size or appearance of the
previously biopsied nodule in the left inferior gland.
2. Decreasing size of sonographically low risk/benign mixed cystic
and solid nodule in the right inferior gland.
3. No new nodules or suspicious features.

## 2023-02-21 DIAGNOSIS — Z130 Encounter for screening for diseases of the blood and blood-forming organs and certain disorders involving the immune mechanism: Secondary | ICD-10-CM | POA: Diagnosis not present

## 2023-02-21 DIAGNOSIS — Z01419 Encounter for gynecological examination (general) (routine) without abnormal findings: Secondary | ICD-10-CM | POA: Diagnosis not present

## 2023-02-21 DIAGNOSIS — Z202 Contact with and (suspected) exposure to infections with a predominantly sexual mode of transmission: Secondary | ICD-10-CM | POA: Diagnosis not present

## 2023-02-21 DIAGNOSIS — Z1231 Encounter for screening mammogram for malignant neoplasm of breast: Secondary | ICD-10-CM | POA: Diagnosis not present

## 2023-04-07 DIAGNOSIS — H1013 Acute atopic conjunctivitis, bilateral: Secondary | ICD-10-CM | POA: Diagnosis not present

## 2023-05-09 ENCOUNTER — Other Ambulatory Visit: Payer: Self-pay

## 2023-05-09 ENCOUNTER — Telehealth: Payer: Self-pay | Admitting: Family

## 2023-05-09 MED ORDER — GABAPENTIN 300 MG PO CAPS: ORAL_CAPSULE | ORAL | 1 refills | Status: DC

## 2023-05-09 NOTE — Telephone Encounter (Signed)
Prescription Request  05/09/2023  Is this a "Controlled Substance" medicine? No  LOV: 01/03/2023  What is the name of the medication or equipment?   gabapentin (NEURONTIN) 300 MG capsule  Have you contacted your pharmacy to request a refill? No   Which pharmacy would you like this sent to?   [facility] on [address] [place]   Phone: [phone] Fax: [phone]    Patient notified that their request is being sent to the clinical staff for review and that they should receive a response within 2 business days.   Please advise at [facility] [phone]

## 2023-05-09 NOTE — Telephone Encounter (Signed)
Rx sent 

## 2023-06-27 ENCOUNTER — Ambulatory Visit
Admission: EM | Admit: 2023-06-27 | Discharge: 2023-06-27 | Disposition: A | Discharge status: Home / Self Care | Payer: Federal, State, Local not specified - PPO | Attending: Family Medicine | Admitting: Family Medicine

## 2023-06-27 DIAGNOSIS — N760 Acute vaginitis: Secondary | ICD-10-CM | POA: Insufficient documentation

## 2023-06-27 MED ORDER — FLUCONAZOLE 150 MG PO TABS: 150.0000 mg | ORAL_TABLET | ORAL | 0 refills | Status: DC

## 2023-06-27 NOTE — ED Provider Notes (Signed)
 [facility]  Note:  This document was prepared using Conservation officer, historic buildings and may include unintentional dictation errors.  MRN: [id number] DOB: 1965/12/22  Subjective:   Christie [name] is a 58 y.o. female presenting for 2-day history of thick chalky white vaginal discharge that is malodorous.  No urinary symptoms.  Is not opposed to STI testing.  No current facility-administered medications for this encounter.  Current Outpatient Medications:    azithromycin  (ZITHROMAX ) 250 MG tablet, Take 1 tablet (250 mg total) by mouth daily. Take first 2 tablets together, then 1 every day until finished., Disp: 6 tablet, Rfl: 0   CALCIUM-VITAMIN D PO, Take 1 capsule by mouth daily., Disp: , Rfl:    cetirizine  (ZYRTEC ) 10 MG tablet, Take 1 tablet (10 mg total) by mouth daily., Disp: 30 tablet, Rfl: 11   citalopram  (CELEXA ) 20 MG tablet, TAKE 1 AND 1/2 TABLETS DAILY BY MOUTH, Disp: 135 tablet, Rfl: 1   dextromethorphan-guaiFENesin (MUCINEX DM) 30-600 MG 12hr tablet, Take 1 tablet by mouth 2 (two) times daily., Disp: , Rfl:    EYSUVIS 0.25 % SUSP, Place 1 drop into both eyes daily., Disp: , Rfl:    fluticasone  (FLONASE ) 50 MCG/ACT nasal spray, Place 2 sprays into both nostrils daily as needed for allergies., Disp: 9.9 mL, Rfl: 5   gabapentin  (NEURONTIN ) 300 MG capsule, TAKE 1 CAPSULE BY MOUTH EVERYDAY AT BEDTIME, Disp: 90 capsule, Rfl: 1   nicotine  (NICODERM CQ  - DOSED IN MG/24 HOURS) 21 mg/24hr patch, Place 1 patch (21 mg total) onto the skin daily., Disp: 30 patch, Rfl: 1   varenicline  (CHANTIX  PAK) 0.5 MG X 11 & 1 MG X 42 tablet, Take one 0.5 mg tablet by mouth once daily for 3 days, then increase to one 0.5 mg tablet twice daily for 4 days, then increase to one 1 mg tablet twice daily., Disp: 53 tablet, Rfl: 0   No Known Allergies  Past Medical History:  Diagnosis Date   Anemia    years ago   Anxiety    Eczema 10/2015   HSV (herpes simplex virus)  infection    Hyperglycemia    Hyperlipidemia 08/2021   no meds, diet controlled   Multinodular goiter 03/18/2022     Past Surgical History:  Procedure Laterality Date   ABLATION  06/13/2008   uterine   CESAREAN SECTION     x 1   COLONOSCOPY     HYSTEROSCOPY  06/13/2008   per patient   ORIF ANKLE FRACTURE Left 08/26/2022   Procedure: OPEN REDUCTION INTERNAL FIXATION (ORIF) LEFT ANKLE FRACTURE;  Surgeon: Harden Jerona GAILS, MD;  Location: [facility] OR;  Service: Orthopedics;  Laterality: Left;   WISDOM TOOTH EXTRACTION      Family History  Problem Relation Age of Onset   Hypertension Mother    Heart failure Mother    Lung cancer Mother        s/p resection   Diabetes Maternal Grandmother        type II   Hypertension Maternal Grandmother    Alcohol abuse Other        family hx   Anxiety disorder Other        family hx   Arthritis Other        family hx   Hypertension Other        family hx   Thyroid  disease Other        family hx   Gallstones Brother  Hypertension Brother     Social History   Tobacco Use   Smoking status: Every Day    Current packs/day: 0.50    Types: Cigarettes   Smokeless tobacco: Former   Tobacco comments:    10 cigarettes daily  Vaping Use   Vaping status: Some Days   Substances: Nicotine , Flavoring  Substance Use Topics   Alcohol use: Not Currently    Alcohol/week: 7.0 - 14.0 standard drinks of alcohol    Types: 7 - 14 Standard drinks or equivalent per week    Comment: daily 1-2 glass wine/day   Drug use: Yes    Frequency: 7.0 times per week    Types: Marijuana    ROS   Objective:   Vitals: BP (!) 143/93 (BP Location: Right Arm)   Pulse 62   Temp 98.2 F (36.8 C) (Oral)   Resp 16   LMP  (LMP Unknown) Comment: Hx uterine ablation  SpO2 97%   Physical Exam Constitutional:      General: She is not in acute distress.    Appearance: Normal appearance. She is well-developed. She is not ill-appearing, toxic-appearing or  diaphoretic.  HENT:     Head: Normocephalic and atraumatic.     Nose: Nose normal.     Mouth/Throat:     Mouth: Mucous membranes are moist.  Eyes:     General: No scleral icterus.       Right eye: No discharge.        Left eye: No discharge.     Extraocular Movements: Extraocular movements intact.     Conjunctiva/sclera: Conjunctivae normal.  Cardiovascular:     Rate and Rhythm: Normal rate.  Pulmonary:     Effort: Pulmonary effort is normal.  Abdominal:     General: Bowel sounds are normal. There is no distension.     Palpations: Abdomen is soft. There is no mass.     Tenderness: There is no abdominal tenderness. There is no right CVA tenderness, left CVA tenderness, guarding or rebound.  Skin:    General: Skin is warm and dry.  Neurological:     General: No focal deficit present.     Mental Status: She is alert and oriented to person, place, and time.  Psychiatric:        Mood and Affect: Mood normal.        Behavior: Behavior normal.        Thought Content: Thought content normal.        Judgment: Judgment normal.     Assessment and Plan :   PDMP not reviewed this encounter.  1. Acute vaginitis    Patient has a history of yeast infections and would like to be covered for this.  Will base treatment off of her results otherwise at patient request. Counseled patient on potential for adverse effects with medications prescribed/recommended today, ER and return-to-clinic precautions discussed, patient verbalized understanding.    Christopher Savannah, [place] 06/30/23 [phone]

## 2023-06-27 NOTE — ED Triage Notes (Signed)
 Pt reports chalky vaginal discharge x 2 days; bad smell in the vaginal discharge started today.

## 2023-06-28 ENCOUNTER — Telehealth (HOSPITAL_COMMUNITY): Payer: Self-pay

## 2023-06-28 LAB — CERVICOVAGINAL ANCILLARY ONLY
Bacterial Vaginitis (gardnerella): POSITIVE — AB
Candida Glabrata: NEGATIVE
Candida Vaginitis: NEGATIVE
Chlamydia: NEGATIVE
Comment: NEGATIVE
Comment: NEGATIVE
Comment: NEGATIVE
Comment: NEGATIVE
Comment: NEGATIVE
Comment: NORMAL
Neisseria Gonorrhea: NEGATIVE
Trichomonas: NEGATIVE

## 2023-06-28 MED ORDER — METRONIDAZOLE 500 MG PO TABS: 500.0000 mg | ORAL_TABLET | Freq: Two times a day (BID) | ORAL | 0 refills | Status: AC

## 2023-06-28 NOTE — Telephone Encounter (Signed)
 Per protocol, pt requires tx with metronidazole. Rx sent to pharmacy on file.

## 2023-07-07 ENCOUNTER — Telehealth (HOSPITAL_BASED_OUTPATIENT_CLINIC_OR_DEPARTMENT_OTHER): Payer: Self-pay

## 2023-07-07 MED ORDER — FLUCONAZOLE 150 MG PO TABS: 150.0000 mg | ORAL_TABLET | Freq: Once | ORAL | 0 refills | Status: AC

## 2023-07-07 NOTE — Telephone Encounter (Signed)
See patient message encounter. Sent per protocol for pt request for abx-associated yeast infection.

## 2023-09-01 DIAGNOSIS — H10023 Other mucopurulent conjunctivitis, bilateral: Secondary | ICD-10-CM | POA: Diagnosis not present

## 2023-09-29 DIAGNOSIS — H10013 Acute follicular conjunctivitis, bilateral: Secondary | ICD-10-CM | POA: Diagnosis not present

## 2023-10-30 ENCOUNTER — Other Ambulatory Visit: Payer: Self-pay | Admitting: Family

## 2023-11-04 ENCOUNTER — Other Ambulatory Visit: Payer: Self-pay | Admitting: Family

## 2023-11-30 ENCOUNTER — Other Ambulatory Visit: Payer: Self-pay | Admitting: Family

## 2023-12-31 ENCOUNTER — Other Ambulatory Visit: Payer: Self-pay | Admitting: Family

## 2024-01-25 ENCOUNTER — Ambulatory Visit
Admission: RE | Admit: 2024-01-25 | Discharge: 2024-01-25 | Disposition: A | Discharge status: Home / Self Care | Source: Ambulatory Visit | Attending: Family Medicine

## 2024-01-25 VITALS — BP 129/90 | HR 70 | Temp 98.2°F | Resp 16

## 2024-01-25 DIAGNOSIS — N760 Acute vaginitis: Secondary | ICD-10-CM | POA: Diagnosis not present

## 2024-01-25 DIAGNOSIS — N3001 Acute cystitis with hematuria: Secondary | ICD-10-CM | POA: Insufficient documentation

## 2024-01-25 DIAGNOSIS — R300 Dysuria: Secondary | ICD-10-CM | POA: Insufficient documentation

## 2024-01-25 DIAGNOSIS — Z113 Encounter for screening for infections with a predominantly sexual mode of transmission: Secondary | ICD-10-CM | POA: Insufficient documentation

## 2024-01-25 LAB — POCT URINE DIPSTICK
Bilirubin, UA: NEGATIVE
Glucose, UA: NEGATIVE mg/dL
Ketones, POC UA: NEGATIVE mg/dL
Nitrite, UA: NEGATIVE
POC PROTEIN,UA: NEGATIVE
Spec Grav, UA: 1.02 (ref 1.010–1.025)
Urobilinogen, UA: 0.2 U/dL
pH, UA: 5.5 (ref 5.0–8.0)

## 2024-01-25 MED ORDER — CEPHALEXIN 500 MG PO CAPS
500.0000 mg | ORAL_CAPSULE | Freq: Two times a day (BID) | ORAL | 0 refills | Status: AC
Start: 2024-01-25 — End: 2024-02-01

## 2024-01-25 MED ORDER — FLUCONAZOLE 150 MG PO TABS
150.0000 mg | ORAL_TABLET | Freq: Every day | ORAL | 0 refills | Status: AC
Start: 2024-01-25 — End: 2024-01-27

## 2024-01-25 NOTE — Discharge Instructions (Addendum)
 The clinic will contact you with results of the urine culture as well as vaginal swab/STD testing done today if positive.  Start Keflex  twice daily for 7 days to treat your urinary tract infection symptoms.  Start Diflucan  twice daily for yeast infection symptoms.  Lots of rest and fluids.  Please follow-up with your PCP or gynecologist if symptoms do not improve.  Please go to the ER for any worsening symptoms.  Hope you feel better soon!

## 2024-01-25 NOTE — ED Provider Notes (Signed)
 [facility]    CSN: [id number] Arrival date & time: 01/25/24  9141      History   Chief Complaint Chief Complaint  Patient presents with   Vaginal Discharge    Burning sensation, hurts to use tie bathroom - Entered by patient    HPI Christie [name] is a 58 y.o. female presents for vaginal discharge and dysuria.  Patient reports 4 days of a pruritic vaginal discharge with dysuria.  Denies hematuria, fevers, nausea/vomiting, flank pain.  No known STD exposure but would like screening.  Recent sexual encounter that was protected.  Does report history of yeast infections and states this feels consistent with that.  She did over-the-counter Monistat without any improvement.  No other concerns at this time.   Vaginal Discharge Associated symptoms: dysuria     Past Medical History:  Diagnosis Date   Anemia    years ago   Anxiety    Eczema 10/2015   HSV (herpes simplex virus) infection    Hyperglycemia    Hyperlipidemia 08/2021   no meds, diet controlled   Multinodular goiter 03/18/2022    Patient Active Problem List   Diagnosis Date Noted   Community acquired pneumonia 01/05/2023   Acute left ankle pain 08/24/2022   Closed nondisplaced fracture of lateral malleolus of left fibula 08/24/2022   Acute conjunctivitis of both eyes 09/07/2021   Hyperglycemia 08/18/2021   Multinodular goiter 01/14/2020   Eczema 07/11/2015   Tobacco abuse 09/19/2014   Preventative health care 06/01/2014   Hot flashes 05/21/2013   Epicondylitis elbow, medial 05/21/2013   Alopecia 02/25/2013   Back pain 04/03/2012   Anxiety state 06/25/2010   Allergic rhinitis 06/25/2010    Past Surgical History:  Procedure Laterality Date   ABLATION  06/13/2008   uterine   CESAREAN SECTION     x 1   COLONOSCOPY     HYSTEROSCOPY  06/13/2008   per patient   ORIF ANKLE FRACTURE Left 08/26/2022   Procedure: OPEN REDUCTION INTERNAL FIXATION (ORIF) LEFT ANKLE FRACTURE;  Surgeon: Harden Jerona GAILS, MD;  Location: [facility] OR;  Service: Orthopedics;  Laterality: Left;   WISDOM TOOTH EXTRACTION      OB History   No obstetric history on file.      Home Medications    Prior to Admission medications   Medication Sig Start Date End Date Taking? Authorizing Provider  cephALEXin  (KEFLEX ) 500 MG capsule Take 1 capsule (500 mg total) by mouth 2 (two) times daily for 7 days. 01/25/24 02/01/24 Yes Ashrith Sagan, Jodi R, NP  fluconazole  (DIFLUCAN ) 150 MG tablet Take 1 tablet (150 mg total) by mouth daily for 2 doses. Take 1 tablet today and may repeat in 3 days if symptoms persist 01/25/24 01/27/24 Yes Chelci [place], Jodi R, NP  Alcaftadine (LASTACAFT) 0.25 % SOLN     [provider]  CALCIUM-VITAMIN D PO Take 1 capsule by mouth daily.    [provider]  cetirizine  (ZYRTEC ) 10 MG tablet Take 1 tablet (10 mg total) by mouth daily. 11/15/19   O'Sullivan, Melissa, NP  citalopram  (CELEXA ) 20 MG tablet TAKE 1 AND 1/2 TABLETS BY MOUTH DAILY 11/06/23   O'Sullivan, Melissa, NP  cromolyn (OPTICROM) 4 % ophthalmic solution 1 drop 2 (two) times daily. 04/11/23   [provider]  dextromethorphan-guaiFENesin (MUCINEX DM) 30-600 MG 12hr tablet Take 1 tablet by mouth 2 (two) times daily.    [provider]  EYSUVIS 0.25 % SUSP Place 1 drop into both eyes  daily. 05/08/20   [provider]  fluticasone  (FLONASE ) 50 MCG/ACT nasal spray Place 2 sprays into both nostrils daily as needed for allergies. 08/24/22   O'Sullivan, Melissa, NP  gabapentin  (NEURONTIN ) 300 MG capsule TAKE 1 CAPSULE (300 MG TOTAL) BY MOUTH AT BEDTIME. NEEDS APPT 01/01/24   Webb, Padonda B, FNP  varenicline  (CHANTIX  PAK) 0.5 MG X 11 & 1 MG X 42 tablet Take one 0.5 mg tablet by mouth once daily for 3 days, then increase to one 0.5 mg tablet twice daily for 4 days, then increase to one 1 mg tablet twice daily. 08/17/21   Daryl Setter, NP    Family History Family History  Problem Relation Age of Onset    Hypertension Mother    Heart failure Mother    Lung cancer Mother        s/p resection   Diabetes Maternal Grandmother        type II   Hypertension Maternal Grandmother    Alcohol abuse Other        family hx   Anxiety disorder Other        family hx   Arthritis Other        family hx   Hypertension Other        family hx   Thyroid  disease Other        family hx   Gallstones Brother    Hypertension Brother     Social History Social History   Tobacco Use   Smoking status: Every Day    Current packs/day: 0.50    Types: Cigarettes   Smokeless tobacco: Former   Tobacco comments:    10 cigarettes daily  Vaping Use   Vaping status: Some Days   Substances: Nicotine , Flavoring  Substance Use Topics   Alcohol use: Not Currently    Alcohol/week: 7.0 - 14.0 standard drinks of alcohol    Types: 7 - 14 Standard drinks or equivalent per week    Comment: daily 1-2 glass wine/day   Drug use: Yes    Frequency: 7.0 times per week    Types: Marijuana     Allergies   Patient has no known allergies.   Review of Systems Review of Systems  Genitourinary:  Positive for dysuria and vaginal discharge.     Physical Exam Triage Vital Signs ED Triage Vitals [01/25/24 0907]  Encounter Vitals Group     BP (!) 129/90     Girls Systolic BP Percentile      Girls Diastolic BP Percentile      Boys Systolic BP Percentile      Boys Diastolic BP Percentile      Pulse Rate 70     Resp 16     Temp 98.2 F (36.8 C)     Temp Source Oral     SpO2 96 %     Weight      Height      Head Circumference      Peak Flow      Pain Score 0     Pain Loc      Pain Education      Exclude from Growth Chart    No data found.  Updated Vital Signs BP (!) 129/90 (BP Location: Left Arm)   Pulse 70   Temp 98.2 F (36.8 C) (Oral)   Resp 16   LMP  (LMP Unknown) Comment: Hx uterine ablation  SpO2 96%   Visual Acuity Right Eye Distance:   Left  Eye Distance:   Bilateral Distance:    Right  Eye Near:   Left Eye Near:    Bilateral Near:     Physical Exam Vitals and nursing note reviewed.  Constitutional:      Appearance: Normal appearance.  HENT:     Head: Normocephalic and atraumatic.  Eyes:     Pupils: Pupils are equal, round, and reactive to light.  Cardiovascular:     Rate and Rhythm: Normal rate.  Pulmonary:     Effort: Pulmonary effort is normal.  Abdominal:     Tenderness: There is no right CVA tenderness or left CVA tenderness.  Skin:    General: Skin is warm and dry.  Neurological:     General: No focal deficit present.     Mental Status: She is alert and oriented to person, place, and time.  Psychiatric:        Mood and Affect: Mood normal.        Behavior: Behavior normal.      UC Treatments / Results  Labs (all labs ordered are listed, but only abnormal results are displayed) Labs Reviewed  POCT URINE DIPSTICK - Abnormal; Notable for the following components:      Result Value   Blood, UA trace-intact (*)    Leukocytes, UA Small (1+) (*)    All other components within normal limits  URINE CULTURE  RPR  HIV ANTIBODY (ROUTINE TESTING W REFLEX)  CERVICOVAGINAL ANCILLARY ONLY    EKG   Radiology No results found.  Procedures Procedures (including critical care time)  Medications Ordered in UC Medications - No data to display  Initial Impression / Assessment and Plan / UC Course  I have reviewed the triage vital signs and the nursing notes.  Pertinent labs & imaging results that were available during my care of the patient were reviewed by me and considered in my medical decision making (see chart for details).     Reviewed exam and symptoms with patient.  No red flags.  Vaginal swab/STD testing is ordered we will contact for any positive results.  Will start Diflucan  for yeast infection symptoms.  Urine positive for UTI, will culture and start Keflex  twice daily.  Encouraged rest fluids and PCP or GYN follow-up if symptoms do not  improve.  ER precautions reviewed. Final Clinical Impressions(s) / UC Diagnoses   Final diagnoses:  Dysuria  Acute cystitis with hematuria  Screening examination for STD (sexually transmitted disease)  Acute vaginitis     Discharge Instructions      The clinic will contact you with results of the urine culture as well as vaginal swab/STD testing done today if positive.  Start Keflex  twice daily for 7 days to treat your urinary tract infection symptoms.  Start Diflucan  twice daily for yeast infection symptoms.  Lots of rest and fluids.  Please follow-up with your PCP or gynecologist if symptoms do not improve.  Please go to the ER for any worsening symptoms.  Hope you feel better soon!    ED Prescriptions     Medication Sig Dispense Auth. Provider   fluconazole  (DIFLUCAN ) 150 MG tablet Take 1 tablet (150 mg total) by mouth daily for 2 doses. Take 1 tablet today and may repeat in 3 days if symptoms persist 2 tablet Jennamarie Goings, Jodi R, NP   cephALEXin  (KEFLEX ) 500 MG capsule Take 1 capsule (500 mg total) by mouth 2 (two) times daily for 7 days. 14 capsule Deshundra Waller, Jodi R, NP  PDMP not reviewed this encounter.   Loreda Myla SAUNDERS, NP 01/25/24 [phone]

## 2024-01-25 NOTE — ED Triage Notes (Signed)
 Pt present with vaginal discharge and burning sensation. Pt states she used protection and does not know if she is allergic to latex. Reports she feels a burning sensation when voiding.

## 2024-01-26 ENCOUNTER — Ambulatory Visit: Payer: Self-pay

## 2024-01-26 LAB — CERVICOVAGINAL ANCILLARY ONLY
Bacterial Vaginitis (gardnerella): POSITIVE — AB
Candida Glabrata: NEGATIVE
Candida Vaginitis: NEGATIVE
Chlamydia: NEGATIVE
Comment: NEGATIVE
Comment: NEGATIVE
Comment: NEGATIVE
Comment: NEGATIVE
Comment: NEGATIVE
Comment: NORMAL
Neisseria Gonorrhea: NEGATIVE
Trichomonas: NEGATIVE

## 2024-01-26 LAB — URINE CULTURE: Culture: NO GROWTH

## 2024-01-26 LAB — HIV ANTIBODY (ROUTINE TESTING W REFLEX): HIV Screen 4th Generation wRfx: NONREACTIVE

## 2024-01-26 LAB — RPR: RPR Ser Ql: NONREACTIVE

## 2024-01-26 MED ORDER — METRONIDAZOLE 500 MG PO TABS: 500.0000 mg | ORAL_TABLET | Freq: Two times a day (BID) | ORAL | 0 refills | Status: AC

## 2024-01-29 MED ORDER — METRONIDAZOLE 0.75 % VA GEL: 1.0000 | Freq: Every day | VAGINAL | 0 refills | Status: AC

## 2024-01-29 NOTE — Addendum Note (Signed)
 Addended by: ARNALDO ALFONSO RAMAN on: 01/29/2024 02:17 PM   Modules accepted: Orders

## 2024-02-06 ENCOUNTER — Ambulatory Visit (INDEPENDENT_AMBULATORY_CARE_PROVIDER_SITE_OTHER): Admitting: Family

## 2024-02-06 VITALS — BP 123/79 | HR 61 | Temp 98.2°F | Resp 16 | Ht 64.0 in | Wt 126.0 lb

## 2024-02-06 DIAGNOSIS — F411 Generalized anxiety disorder: Secondary | ICD-10-CM | POA: Diagnosis not present

## 2024-02-06 DIAGNOSIS — Z0000 Encounter for general adult medical examination without abnormal findings: Secondary | ICD-10-CM

## 2024-02-06 DIAGNOSIS — Z23 Encounter for immunization: Secondary | ICD-10-CM

## 2024-02-06 DIAGNOSIS — E785 Hyperlipidemia, unspecified: Secondary | ICD-10-CM | POA: Diagnosis not present

## 2024-02-06 DIAGNOSIS — E042 Nontoxic multinodular goiter: Secondary | ICD-10-CM

## 2024-02-06 DIAGNOSIS — R739 Hyperglycemia, unspecified: Secondary | ICD-10-CM

## 2024-02-06 MED ORDER — CITALOPRAM HYDROBROMIDE 40 MG PO TABS: 40.0000 mg | ORAL_TABLET | Freq: Every day | ORAL | 3 refills | Status: DC

## 2024-02-06 MED ORDER — GABAPENTIN 300 MG PO CAPS: 300.0000 mg | ORAL_CAPSULE | Freq: Every day | ORAL | 1 refills | Status: AC

## 2024-02-06 NOTE — Assessment & Plan Note (Signed)
 FNA of left sided nodule 2021- Consistent with benign follicular nodule ([employer] category II).  Update TSH.

## 2024-02-06 NOTE — Progress Notes (Signed)
 Subjective:     Patient ID: Christie [name], female    DOB: [birth year], 58 y.o.   MRN: [id number]  Chief Complaint  Patient presents with   Annual Exam    HPI  Discussed the use of AI scribe software for clinical note transcription with the patient, who gave verbal consent to proceed.  History of Present Illness  Christie [name] is a 58 year old female who presents for an annual physical exam.  She has a history of tobacco use and is attempting to reduce nicotine  intake by switching to lighter cigarettes. She is considering all-natural options and describes smoking as a habit when idle.  Her diet is irregular, often skipping breakfast and eating late. She occasionally eats grits for breakfast and is considering adding protein bars or yogurt. She is physically active at work but does not engage in formal exercise. She plans to resume dancing in September and is considering walking more frequently.  She recently experienced a UTI, initially treated with antibiotics, then switched to a bacterial cream due to side effects. She is completing the treatment this week. An eye infection has healed well and is monitored by her eye doctor.  She is on citalopram  30 mg for anxiety and is considering increasing to 40 mg. She also uses gabapentin , possibly for hot flashes. She is up to date with her mammograms, with an appointment scheduled for September 16th. She receives 3D mammograms annually.  Immunizations:declines prevnar 20 Diet: skips breakfast.  Exercise: no formal exercise- plans to start back dancing though/walking Colonoscopy: up to date Pap Smear: up to date Mammogram: will update at [facility]: will schedule Dental:  not up to date     Health Maintenance Due  Topic Date Due   Hepatitis B Vaccines 19-59 Average Risk (1 of 3 - 19+ 3-dose series) Never done   MAMMOGRAM  08/04/2022   COVID-19 Vaccine (4 - 2024-25 season) 02/12/2023   INFLUENZA VACCINE   01/12/2024    Past Medical History:  Diagnosis Date   Anemia    years ago   Anxiety    Eczema 10/2015   HSV (herpes simplex virus) infection    Hyperglycemia    Hyperlipidemia 08/2021   no meds, diet controlled   Multinodular goiter 03/18/2022    Past Surgical History:  Procedure Laterality Date   ABLATION  06/13/2008   uterine   CESAREAN SECTION     x 1   COLONOSCOPY     HYSTEROSCOPY  06/13/2008   per patient   ORIF ANKLE FRACTURE Left 08/26/2022   Procedure: OPEN REDUCTION INTERNAL FIXATION (ORIF) LEFT ANKLE FRACTURE;  Surgeon: Harden Jerona GAILS, MD;  Location: [facility] OR;  Service: Orthopedics;  Laterality: Left;   WISDOM TOOTH EXTRACTION      Family History  Problem Relation Age of Onset   Hypertension Mother    Heart failure Mother    Lung cancer Mother        s/p resection   Diabetes Maternal Grandmother        type II   Hypertension Maternal Grandmother    Alcohol abuse Other        family hx   Anxiety disorder Other        family hx   Arthritis Other        family hx   Hypertension Other        family hx   Thyroid  disease Other        family hx  Gallstones Brother    Hypertension Brother     Social History   Socioeconomic History   Marital status: Married    Spouse name: Not on file   Number of children: Not on file   Years of education: Not on file   Highest education level: Not on file  Occupational History   Not on file  Tobacco Use   Smoking status: Every Day    Current packs/day: 0.50    Types: Cigarettes   Smokeless tobacco: Former   Tobacco comments:    10 cigarettes daily  Vaping Use   Vaping status: Some Days   Substances: Nicotine , Flavoring  Substance and Sexual Activity   Alcohol use: Not Currently    Alcohol/week: 7.0 - 14.0 standard drinks of alcohol    Types: 7 - 14 Standard drinks or equivalent per week    Comment: daily 1-2 glass wine/day   Drug use: Yes    Frequency: 7.0 times per week    Types: Marijuana   Sexual  activity: Yes    Birth control/protection: Other-see comments, None    Comment: uterine ablation  Other Topics Concern   Not on file  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      Social Drivers of Corporate investment banker Strain: Not on file  Food Insecurity: Not on file  Transportation Needs: Not on file  Physical Activity: Not on file  Stress: Not on file  Social Connections: Not on file  Intimate Partner Violence: Not on file    Outpatient Medications Prior to Visit  Medication Sig Dispense Refill   Alcaftadine (LASTACAFT) 0.25 % SOLN      CALCIUM-VITAMIN D PO Take 1 capsule by mouth daily.     cetirizine  (ZYRTEC ) 10 MG tablet Take 1 tablet (10 mg total) by mouth daily. 30 tablet 11   cromolyn (OPTICROM) 4 % ophthalmic solution 1 drop 2 (two) times daily.     dextromethorphan-guaiFENesin (MUCINEX DM) 30-600 MG 12hr tablet Take 1 tablet by mouth 2 (two) times daily.     EYSUVIS 0.25 % SUSP Place 1 drop into both eyes daily.     fluticasone  (FLONASE ) 50 MCG/ACT nasal spray Place 2 sprays into both nostrils daily as needed for allergies. 9.9 mL 5   citalopram  (CELEXA ) 20 MG tablet TAKE 1 AND 1/2 TABLETS BY MOUTH DAILY 135 tablet 1   gabapentin  (NEURONTIN ) 300 MG capsule TAKE 1 CAPSULE (300 MG TOTAL) BY MOUTH AT BEDTIME. NEEDS APPT 30 capsule 0   varenicline  (CHANTIX  PAK) 0.5 MG X 11 & 1 MG X 42 tablet Take one 0.5 mg tablet by mouth once daily for 3 days, then increase to one 0.5 mg tablet twice daily for 4 days, then increase to one 1 mg tablet twice daily. 53 tablet 0   No facility-administered medications prior to visit.    No Known Allergies  Review of Systems  Constitutional:  Negative for weight loss.  HENT:  Negative for congestion and hearing loss.   Eyes:  Negative for blurred vision.  Respiratory:  Negative for cough.   Cardiovascular:   Negative for leg swelling.  Gastrointestinal:  Negative for constipation and diarrhea.  Genitourinary:  Negative for dysuria and frequency.  Musculoskeletal:  Negative for joint pain and myalgias.  Skin:  Negative for rash.  Neurological:  Negative for headaches.  Psychiatric/Behavioral:  Has anxiety,    See HPI    Objective:    Physical Exam   BP 123/79 (BP Location: Right Arm, Patient Position: Sitting, Cuff Size: Small)   Pulse 61   Temp 98.2 F (36.8 C) (Oral)   Resp 16   Ht 5' 4 (1.626 m)   Wt 126 lb (57.2 kg)   LMP  (LMP Unknown) Comment: Hx uterine ablation  SpO2 100%   BMI 21.63 kg/m  Wt Readings from Last 3 Encounters:  02/06/24 126 lb (57.2 kg)  01/03/23 123 lb (55.8 kg)  08/26/22 128 lb (58.1 kg)  Physical Exam  Constitutional: She is oriented to person, place, and time. She appears well-developed and well-nourished. No distress.  HENT:  Head: Normocephalic and atraumatic.  Right Ear: Tympanic membrane and ear canal normal.  Left Ear: Tympanic membrane and ear canal normal.  Mouth/Throat: Oropharynx is clear and moist.  Eyes: Pupils are equal, round, and reactive to light. No scleral icterus.  Neck: Normal range of motion. No thyromegaly present.  Cardiovascular: Normal rate and regular rhythm.   No murmur heard. Pulmonary/Chest: Effort normal and breath sounds normal. No respiratory distress. He has no wheezes. She has no rales. She exhibits no tenderness.  Abdominal: Soft. Bowel sounds are normal. She exhibits no distension and no mass. There is no tenderness. There is no rebound and no guarding.  Musculoskeletal: She exhibits no edema.  Lymphadenopathy:    She has no cervical adenopathy.  Neurological: She is alert and oriented to person, place, and time. She has normal patellar reflexes. She exhibits normal muscle tone. Coordination normal.  Skin: Skin is warm and dry.  Psychiatric: She has a normal mood and affect. Her behavior is normal.  Judgment and thought content normal.  Breast/Pelvic: deferred         Assessment & Plan:        Assessment & Plan:   Problem List Items Addressed This Visit       Unprioritized   Preventative health care    Routine visit focused on ongoing management of existing conditions. - [facility] results post-September 16th. - Administer Prevnar 20 vaccine. - Encourage healthy diet, avoid skipping meals, and regular exercise.  Tobacco Use Continued use with previous unsuccessful nicotine  patch attempt. - Encourage smoking cessation. - Discuss alternative cessation methods if interested.      Multinodular goiter   FNA of left sided nodule 2021- Consistent with benign follicular nodule (Bethesda category II).  Update TSH.      Relevant Orders   TSH   Hyperlipidemia   Relevant Orders   Comp Met (CMET)   Lipid panel   Hyperglycemia - Primary   Relevant Orders   HgB A1c   Anxiety state     Ongoing symptoms despite citalopram  30 mg. - Increase citalopram  to 40 mg daily. - Send [facility] to [facility] on [address].      Relevant Medications   citalopram  (CELEXA ) 40 MG tablet    I have discontinued Cathi B. Tedeschi's varenicline  and citalopram . I am also having her start on citalopram . Additionally, I am having her maintain her CALCIUM-VITAMIN D PO, cetirizine , Eysuvis, fluticasone , dextromethorphan-guaiFENesin, Lastacaft, cromolyn, and gabapentin .  Meds ordered this encounter  Medications   gabapentin  (NEURONTIN ) 300 MG capsule    Sig: Take 1 capsule (300 mg total) by mouth at bedtime. Needs appt    Dispense:  90 capsule    Refill:  1   citalopram  (CELEXA ) 40 MG tablet  Sig: Take 1 tablet (40 mg total) by mouth daily.    Dispense:  30 tablet    Refill:  3    Supervising Provider:   DOMENICA BLACKBIRD A [[id number]]

## 2024-02-06 NOTE — Addendum Note (Signed)
 Addended by: WELLS LEVORN HERO on: 02/06/2024 02:21 PM   Modules accepted: Orders

## 2024-02-06 NOTE — Assessment & Plan Note (Signed)
  Routine visit focused on ongoing management of existing conditions. - Call [facility] for mammogram results post-September 16th. - Administer Prevnar 20 vaccine. - Encourage healthy diet, avoid skipping meals, and regular exercise.  Tobacco Use Continued use with previous unsuccessful nicotine  patch attempt. - Encourage smoking cessation. - Discuss alternative cessation methods if interested.

## 2024-02-06 NOTE — Patient Instructions (Signed)
 VISIT SUMMARY:  Today, we conducted your annual physical exam and discussed your ongoing health concerns, including anxiety, tobacco use, and recent infections. We also reviewed your current medications and planned for upcoming health screenings.  YOUR PLAN:  ADULT WELLNESS VISIT: Routine visit focused on ongoing management of existing conditions. -Call [facility] for mammogram results after your appointment on September 16th. -Administer Prevnar 20 vaccine.  DEPRESSION AND ANXIETY: Ongoing symptoms despite current medication. -Increase citalopram  to 40 mg daily. -[facility] [address].  TOBACCO USE: Continued use with previous unsuccessful nicotine  patch attempt. -Encourage smoking cessation. -Discuss alternative cessation methods if interested.  HYPERLIPIDEMIA: History of mildly elevated cholesterol. -Check cholesterol levels.  PREDIABETES: History of mildly elevated blood glucose. -Check diabetes test.  GENERAL HEALTH MAINTENANCE: Discussed importance of dental/vision care, exercise, and diet. -Encourage routine dental check-ups and cleanings. -Encourage regular exercise, including walking and dancing. -Advise on maintaining a healthy diet, including eating breakfast.

## 2024-02-06 NOTE — Assessment & Plan Note (Signed)
   Ongoing symptoms despite citalopram  30 mg. - Increase citalopram  to 40 mg daily. - [facility] to [facility] on [address].

## 2024-02-07 ENCOUNTER — Encounter: Payer: Self-pay | Admitting: Family

## 2024-02-07 ENCOUNTER — Ambulatory Visit: Payer: Self-pay | Admitting: Family

## 2024-02-07 DIAGNOSIS — E119 Type 2 diabetes mellitus without complications: Secondary | ICD-10-CM | POA: Insufficient documentation

## 2024-02-07 LAB — COMPREHENSIVE METABOLIC PANEL WITH GFR
ALT: 15 U/L (ref 0–35)
AST: 23 U/L (ref 0–37)
Albumin: 4.6 g/dL (ref 3.5–5.2)
Alkaline Phosphatase: 67 U/L (ref 39–117)
BUN: 20 mg/dL (ref 6–23)
CO2: 28 meq/L (ref 19–32)
Calcium: 10.4 mg/dL (ref 8.4–10.5)
Chloride: 104 meq/L (ref 96–112)
Creatinine, Ser: 1.08 mg/dL (ref 0.40–1.20)
GFR: 56.56 mL/min — ABNORMAL LOW (ref >60.00)
Glucose, Bld: 84 mg/dL (ref 70–99)
Potassium: 4.1 meq/L (ref 3.5–5.1)
Sodium: 143 meq/L (ref 135–145)
Total Bilirubin: 0.5 mg/dL (ref 0.2–1.2)
Total Protein: 7.2 g/dL (ref 6.0–8.3)

## 2024-02-07 LAB — LIPID PANEL
Cholesterol: 237 mg/dL — ABNORMAL HIGH (ref 0–200)
HDL: 95.1 mg/dL (ref >39.00)
LDL Cholesterol: 122 mg/dL — ABNORMAL HIGH (ref 0–99)
NonHDL: 142.21
Total CHOL/HDL Ratio: 2
Triglycerides: 100 mg/dL (ref 0.0–149.0)
VLDL: 20 mg/dL (ref 0.0–40.0)

## 2024-02-07 LAB — TSH: TSH: 1.41 u[IU]/mL (ref 0.35–5.50)

## 2024-02-07 LAB — HEMOGLOBIN A1C: Hgb A1c MFr Bld: 6.5 % (ref 4.6–6.5)

## 2024-02-07 NOTE — Telephone Encounter (Signed)
 A1C is 6.5 now in the diabetes range.  She should continue to work on limiting sugars and starches in her diet and getting regular exercise. I would like to see her back in 4 months.

## 2024-02-27 DIAGNOSIS — Z1389 Encounter for screening for other disorder: Secondary | ICD-10-CM | POA: Diagnosis not present

## 2024-02-27 DIAGNOSIS — Z1231 Encounter for screening mammogram for malignant neoplasm of breast: Secondary | ICD-10-CM | POA: Diagnosis not present

## 2024-02-27 DIAGNOSIS — N952 Postmenopausal atrophic vaginitis: Secondary | ICD-10-CM | POA: Diagnosis not present

## 2024-02-27 DIAGNOSIS — A6004 Herpesviral vulvovaginitis: Secondary | ICD-10-CM | POA: Diagnosis not present

## 2024-02-27 DIAGNOSIS — Z01419 Encounter for gynecological examination (general) (routine) without abnormal findings: Secondary | ICD-10-CM | POA: Diagnosis not present

## 2024-02-27 LAB — HM MAMMOGRAPHY

## 2024-03-10 ENCOUNTER — Other Ambulatory Visit: Payer: Self-pay | Admitting: Family

## 2024-03-10 DIAGNOSIS — R232 Flushing: Secondary | ICD-10-CM

## 2024-03-10 DIAGNOSIS — F411 Generalized anxiety disorder: Secondary | ICD-10-CM

## 2024-03-19 DIAGNOSIS — H40033 Anatomical narrow angle, bilateral: Secondary | ICD-10-CM | POA: Diagnosis not present

## 2024-03-19 DIAGNOSIS — H2513 Age-related nuclear cataract, bilateral: Secondary | ICD-10-CM | POA: Diagnosis not present

## 2024-05-21 ENCOUNTER — Encounter: Payer: Self-pay | Admitting: Family

## 2024-05-21 ENCOUNTER — Telehealth: Payer: Self-pay | Admitting: Family

## 2024-05-21 ENCOUNTER — Ambulatory Visit: Admitting: Family

## 2024-05-21 VITALS — BP 106/77 | HR 67 | Temp 98.0°F | Resp 16 | Ht 64.0 in | Wt 127.0 lb

## 2024-05-21 DIAGNOSIS — E119 Type 2 diabetes mellitus without complications: Secondary | ICD-10-CM

## 2024-05-21 DIAGNOSIS — Z23 Encounter for immunization: Secondary | ICD-10-CM

## 2024-05-21 DIAGNOSIS — F411 Generalized anxiety disorder: Secondary | ICD-10-CM

## 2024-05-21 DIAGNOSIS — E785 Hyperlipidemia, unspecified: Secondary | ICD-10-CM

## 2024-05-21 DIAGNOSIS — R232 Flushing: Secondary | ICD-10-CM

## 2024-05-21 LAB — BASIC METABOLIC PANEL WITH GFR
BUN: 21 mg/dL (ref 6–23)
CO2: 27 meq/L (ref 19–32)
Calcium: 10.5 mg/dL (ref 8.4–10.5)
Chloride: 105 meq/L (ref 96–112)
Creatinine, Ser: 1.06 mg/dL (ref 0.40–1.20)
GFR: 57.72 mL/min — ABNORMAL LOW (ref >60.00)
Glucose, Bld: 90 mg/dL (ref 70–99)
Potassium: 3.9 meq/L (ref 3.5–5.1)
Sodium: 142 meq/L (ref 135–145)

## 2024-05-21 LAB — LIPID PANEL
Cholesterol: 229 mg/dL — ABNORMAL HIGH (ref 0–200)
HDL: 102.7 mg/dL (ref >39.00)
LDL Cholesterol: 111 mg/dL — ABNORMAL HIGH (ref 0–99)
NonHDL: 126.23
Total CHOL/HDL Ratio: 2
Triglycerides: 76 mg/dL (ref 0.0–149.0)
VLDL: 15.2 mg/dL (ref 0.0–40.0)

## 2024-05-21 LAB — MICROALBUMIN / CREATININE URINE RATIO
Creatinine,U: 204.6 mg/dL
Microalb Creat Ratio: 4.5 mg/g (ref 0.0–30.0)
Microalb, Ur: 0.9 mg/dL (ref 0.0–1.9)

## 2024-05-21 LAB — HEMOGLOBIN A1C: Hgb A1c MFr Bld: 5.7 % (ref 4.6–6.5)

## 2024-05-21 NOTE — Assessment & Plan Note (Signed)
  LDL cholesterol is 122, which is not optimal for a diabetic. Goal is LDL under 70 to reduce risk of heart attack and stroke. - Repeated cholesterol test today. - Will consider starting low dose cholesterol medication if LDL remains above 70.

## 2024-05-21 NOTE — Patient Instructions (Signed)
  VISIT SUMMARY: Today, you came in for a follow-up visit to discuss your diabetes, cholesterol, anxiety, and menopausal symptoms. We also reviewed your general health maintenance needs.  YOUR PLAN: -TYPE 2 DIABETES MELLITUS: Type 2 diabetes is a condition where your body does not use insulin properly, leading to high blood sugar levels. Your A1c is currently 6.5, which indicates good control. We checked your A1c and urine for protein today, and performed a foot exam. Please ensure you have an annual eye exam and communicate your diabetic status to your eye doctor. Wear slippers or shoes at home to prevent foot injuries. We will meet every three months to repeat your A1c test.  -HYPERLIPIDEMIA: Hyperlipidemia means you have high levels of fats (lipids) in your blood, which can increase your risk of heart disease. Your LDL cholesterol is 122, which is higher than the goal of under 70 for diabetics. We repeated your cholesterol test today and may consider starting a low dose cholesterol medication if your LDL remains above 70.  -ANXIETY DISORDER: Anxiety disorder is a condition characterized by excessive worry or fear. You are currently managing your anxiety with citalopram  40 mg daily. Continue taking this medication as prescribed.  -MENOPAUSAL SYMPTOMS: Menopausal symptoms, such as hot flashes, occur due to hormonal changes. You are taking gabapentin  300 mg at bedtime to manage these symptoms. Continue with this medication, but we also discussed that citalopram  could be an alternative option for hot flashes.  -GENERAL HEALTH MAINTENANCE: For your general health, you are due for a mammogram and a flu shot. It is important to get your flu shot, especially because diabetes increases your risk of complications from the flu. Ensure you have an annual foot exam and eye exam.  INSTRUCTIONS: Please schedule your annual eye exam and communicate your diabetic status to your eye doctor. Also, schedule a  mammogram and get your flu shot. We will meet again in three months to repeat your A1c test.                      Contains text generated by [employer].

## 2024-05-21 NOTE — Assessment & Plan Note (Signed)
 Generally well controlled with gabapentin  and citalopram .

## 2024-05-21 NOTE — Telephone Encounter (Signed)
 Please request mammo from OB/GYN.

## 2024-05-21 NOTE — Assessment & Plan Note (Signed)
  Anxiety is managed with citalopram  40 mg. Stress from family and work may contribute to symptoms. - Continue citalopram  40 mg daily.

## 2024-05-21 NOTE — Assessment & Plan Note (Signed)
  A1c is 6.5, indicating diabetes diagnosis which is new for her. Currently diet-controlled. Discussed that good control is important for eye, cardiovascular and kidney health. - Checked A1c today. - Checked urine for protein today. - Performed foot exam today. Advised pt not to walk barefoot. - Ensure annual eye exam with diabetic status communicated to ophthalmologist. - Advised wearing slippers or shoes at home to prevent foot injuries. - Plan to meet every three months to repeat A1c.

## 2024-05-21 NOTE — Progress Notes (Signed)
 Subjective:     Patient ID: Christie [name], female    DOB: 14-Feb-1966, 58 y.o.   MRN: [id number]  Chief Complaint  Patient presents with   Anxiety    Here for follow up   Insomnia    Here for follow up    Anxiety Symptoms include insomnia.    Insomnia    Discussed the use of AI scribe software for clinical note transcription with the patient, who gave verbal consent to proceed.  History of Present Illness Christie [name] is a 58 year old female with diabetes who presents for a follow-up visit.  She experiences ongoing sleep disturbances, characterized by tossing and turning at night, which she attributes to stress from preparing for a Christmas program at church, working 3 jobs, and having her adult son living with her. She feels her son's presence complicates her home life, adding to her stress.  She is currently taking citalopram  40 mg for anxiety, which she feels is beneficial. She also experiences hot flashes and takes gabapentin  at bedtime to manage them. If she misses a dose of gabapentin , she feels groggy and unwell until she takes it. Despite taking gabapentin , she still wakes up sweating, possibly due to difficulty regulating the heat in her apartment.  Her last recorded A1c was 6.5. She mentions her eyes have been extremely dry, which she suspects might be related to diabetes, although she acknowledges it could be due to other factors like heating. She had her last eye exam with Dr. Jama at [facility].  She has not yet received her flu shot this year but has had a pneumonia vaccine. She expresses some reluctance about getting the flu shot at places like [facility] due to past experiences of feeling unwell afterward, but she is considering getting it at the doctor's office.  Her previous LDL cholesterol result was 122.  Lab Results  Component Value Date   HGBA1C 6.5 02/06/2024   HGBA1C 6.2 03/18/2022   HGBA1C 6.1 08/17/2021   Lab Results   Component Value Date   LDLCALC 122 (H) 02/06/2024   CREATININE 1.08 02/06/2024       Health Maintenance Due  Topic Date Due   Diabetic kidney evaluation - Urine ACR  Never done   Hepatitis B Vaccines 19-59 Average Risk (1 of 3 - 19+ 3-dose series) Never done   OPHTHALMOLOGY EXAM  05/01/2021   Mammogram  08/04/2022   COVID-19 Vaccine (4 - 2025-26 season) 02/12/2024    Past Medical History:  Diagnosis Date   Anemia    years ago   Anxiety    Eczema 10/2015   HSV (herpes simplex virus) infection    Hyperglycemia    Hyperlipidemia 08/2021   no meds, diet controlled   Multinodular goiter 03/18/2022   Type 2 diabetes mellitus without complication, without long-term current use of insulin (HCC) 02/07/2024    Past Surgical History:  Procedure Laterality Date   ABLATION  06/13/2008   uterine   CESAREAN SECTION     x 1   COLONOSCOPY     HYSTEROSCOPY  06/13/2008   per patient   ORIF ANKLE FRACTURE Left 08/26/2022   Procedure: OPEN REDUCTION INTERNAL FIXATION (ORIF) LEFT ANKLE FRACTURE;  Surgeon: Harden Jerona GAILS, MD;  Location: [facility] OR;  Service: Orthopedics;  Laterality: Left;   WISDOM TOOTH EXTRACTION      Family History  Problem Relation Age of Onset   Hypertension Mother    Heart failure Mother  Lung cancer Mother        s/p resection   Diabetes Mother    Gallstones Brother    Hypertension Brother    Diabetes Maternal Grandmother        type II   Hypertension Maternal Grandmother    Alcohol abuse Other        family hx   Anxiety disorder Other        family hx   Arthritis Other        family hx   Hypertension Other        family hx   Thyroid  disease Other        family hx    Social History   Socioeconomic History   Marital status: Married    Spouse name: Not on file   Number of children: Not on file   Years of education: Not on file   Highest education level: Master's degree (e.g., MA, MS, MEng, MEd, MSW, MBA)  Occupational History   Not on file   Tobacco Use   Smoking status: Every Day    Current packs/day: 0.50    Types: Cigarettes   Smokeless tobacco: Former   Tobacco comments:    10 cigarettes daily  Vaping Use   Vaping status: Some Days   Substances: Nicotine , Flavoring  Substance and Sexual Activity   Alcohol use: Not Currently    Alcohol/week: 7.0 - 14.0 standard drinks of alcohol    Types: 7 - 14 Standard drinks or equivalent per week    Comment: daily 1-2 glass wine/day   Drug use: Yes    Frequency: 7.0 times per week    Types: Marijuana   Sexual activity: Yes    Birth control/protection: Other-see comments, None    Comment: uterine ablation  Other Topics Concern   Not on file  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      Social Drivers of Health   Financial Resource Strain: Medium Risk (05/20/2024)   Overall Financial Resource Strain (CARDIA)    Difficulty of Paying Living Expenses: Somewhat hard  Food Insecurity: Food Insecurity Present (05/20/2024)   Hunger Vital Sign    Worried About Running Out of Food in the Last Year: Sometimes true    Ran Out of Food in the Last Year: Sometimes true  Transportation Needs: No Transportation Needs (05/20/2024)   PRAPARE - Administrator, Civil Service (Medical): No    Lack of Transportation (Non-Medical): No  Physical Activity: Sufficiently Active (05/20/2024)   Exercise Vital Sign    Days of Exercise per Week: 3 days    Minutes of Exercise per Session: 60 min  Stress: Not on file  Social Connections: Unknown (05/20/2024)   Social Connection and Isolation Panel    Frequency of Communication with Friends and Family: Twice a week    Frequency of Social Gatherings with Friends and Family: Three times a week    Attends Religious Services: More than 4 times per year    Active Member of Clubs or Organizations: Yes    Attends Tax Inspector Meetings: More than 4 times per year    Marital Status: Patient declined  Intimate Partner Violence: Not on file    Outpatient Medications Prior to Visit  Medication Sig Dispense Refill   Alcaftadine (LASTACAFT) 0.25 % SOLN      CALCIUM-VITAMIN  D PO Take 1 capsule by mouth daily.     cetirizine  (ZYRTEC ) 10 MG tablet Take 1 tablet (10 mg total) by mouth daily. 30 tablet 11   citalopram  (CELEXA ) 40 MG tablet TAKE 1 TABLET BY MOUTH EVERY DAY 90 tablet 1   cromolyn (OPTICROM) 4 % ophthalmic solution 1 drop 2 (two) times daily.     dextromethorphan-guaiFENesin (MUCINEX DM) 30-600 MG 12hr tablet Take 1 tablet by mouth 2 (two) times daily.     EYSUVIS 0.25 % SUSP Place 1 drop into both eyes daily.     fluticasone  (FLONASE ) 50 MCG/ACT nasal spray Place 2 sprays into both nostrils daily as needed for allergies. 9.9 mL 5   gabapentin  (NEURONTIN ) 300 MG capsule Take 1 capsule (300 mg total) by mouth at bedtime. Needs appt 90 capsule 1   No facility-administered medications prior to visit.    No Known Allergies  Review of Systems  Psychiatric/Behavioral:  The patient has insomnia.    See HPI    Objective:    Physical Exam Constitutional:      General: She is not in acute distress.    Appearance: Normal appearance. She is well-developed.  HENT:     Head: Normocephalic and atraumatic.     Right Ear: External ear normal.     Left Ear: External ear normal.  Eyes:     General: No scleral icterus. Neck:     Thyroid : No thyromegaly.  Cardiovascular:     Rate and Rhythm: Normal rate and regular rhythm.     Heart sounds: Normal heart sounds. No murmur heard. Pulmonary:     Effort: Pulmonary effort is normal. No respiratory distress.     Breath sounds: Normal breath sounds. No wheezing.  Musculoskeletal:     Cervical back: Neck supple.  Skin:    General: Skin is warm and dry.  Neurological:     Mental Status: She is alert and oriented to person, place, and time.   Psychiatric:        Mood and Affect: Mood normal.        Behavior: Behavior normal.        Thought Content: Thought content normal.        Judgment: Judgment normal.      BP 106/77 (BP Location: Right Arm, Patient Position: Sitting, Cuff Size: Small)   Pulse 67   Temp 98 F (36.7 C) (Oral)   Resp 16   Ht 5' 4 (1.626 m)   Wt 127 lb (57.6 kg)   LMP  (LMP Unknown) Comment: Hx uterine ablation  SpO2 99%   BMI 21.80 kg/m  Wt Readings from Last 3 Encounters:  05/21/24 127 lb (57.6 kg)  02/06/24 126 lb (57.2 kg)  01/03/23 123 lb (55.8 kg)       Assessment & Plan:   Problem List Items Addressed This Visit       Unprioritized   Type 2 diabetes mellitus without complication, without long-term current use of insulin (HCC) - Primary    A1c is 6.5, indicating diabetes diagnosis which is new for her. Currently diet-controlled. Discussed that good control is important for eye, cardiovascular and kidney health. - Checked A1c today. - Checked urine for protein today. - Performed foot exam today. Advised pt not to walk barefoot. - Ensure annual eye exam with diabetic status communicated to ophthalmologist. - Advised wearing slippers or shoes at home to prevent foot injuries. - Plan to meet every three months to repeat A1c.  Relevant Orders   HgB A1c   Urine Microalbumin w/creat. ratio   Basic Metabolic Panel (BMET)   Hyperlipidemia    LDL cholesterol is 122, which is not optimal for a diabetic. Goal is LDL under 70 to reduce risk of heart attack and stroke. - Repeated cholesterol test today. - Will consider starting low dose cholesterol medication if LDL remains above 70.      Relevant Orders   Lipid panel   Hot flashes   Generally well controlled with gabapentin  and citalopram .       Anxiety state    Anxiety is managed with citalopram  40 mg. Stress from family and work may contribute to symptoms. - Continue citalopram  40 mg daily.      Other Visit Diagnoses        Needs flu shot       Relevant Orders   Flu vaccine trivalent PF, 6mos and older(Flulaval,Afluria,Fluarix,Fluzone) (Completed)      General health maintenance Mammogram is due. Discussed flu shot due to increased risk of complications from diabetes. - Ensure annual foot exam. - Ensure annual eye exam.  Assessment & Plan    General health maintenance Mammogram is due. Discussed flu shot due to increased risk of complications from diabetes. - Ensure annual foot exam. - Ensure annual eye exam.    I am having Kieren B. Rachels maintain her CALCIUM-VITAMIN D PO, cetirizine , Eysuvis, fluticasone , dextromethorphan-guaiFENesin, Lastacaft, cromolyn, gabapentin , and citalopram .  No orders of the defined types were placed in this encounter.

## 2024-05-21 NOTE — Telephone Encounter (Signed)
 Also please request DM eye exam from [facility] (Dr. Jama).

## 2024-05-22 ENCOUNTER — Ambulatory Visit: Payer: Self-pay | Admitting: Family

## 2024-05-22 DIAGNOSIS — E785 Hyperlipidemia, unspecified: Secondary | ICD-10-CM

## 2024-05-22 MED ORDER — ATORVASTATIN CALCIUM 10 MG PO TABS: 10.0000 mg | ORAL_TABLET | Freq: Every day | ORAL | 1 refills | Status: AC

## 2024-05-22 NOTE — Telephone Encounter (Signed)
 Request faxed to Dr. Ladora at [facility]. Also requested mammogram report from [facility] office.

## 2024-05-22 NOTE — Telephone Encounter (Signed)
 Her A1C is much better- continue the good work with diet/exercise.  I would like for her to add atorvastatin for her cholesterol. Repeat lipid panel in 6 weeks.

## 2024-05-22 NOTE — Progress Notes (Signed)
 Called patient but no answer, left voice mail for patinet to call back.

## 2024-05-23 ENCOUNTER — Encounter: Payer: Self-pay | Admitting: Family

## 2024-05-28 DIAGNOSIS — H16143 Punctate keratitis, bilateral: Secondary | ICD-10-CM | POA: Diagnosis not present

## 2024-08-27 ENCOUNTER — Ambulatory Visit: Admitting: Family
